# Patient Record
Sex: Female | Born: 1990 | Race: White | Hispanic: No | Marital: Single | Smoking: Former smoker
Health system: Southern US, Community
[De-identification: ages and names within clinical notes are randomized; demographics above are authoritative.]

## PROBLEM LIST (undated history)

## (undated) ENCOUNTER — Inpatient Hospital Stay (HOSPITAL_COMMUNITY): Payer: Self-pay

## (undated) DIAGNOSIS — J45909 Unspecified asthma, uncomplicated: Secondary | ICD-10-CM

---
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
## 2010-02-24 ENCOUNTER — Ambulatory Visit: Payer: Self-pay | Admitting: Family Medicine

## 2010-04-27 ENCOUNTER — Ambulatory Visit: Payer: Self-pay | Admitting: Family Medicine

## 2011-04-26 LAB — URINALYSIS, ROUTINE W REFLEX MICROSCOPIC
Glucose, UA: NEGATIVE
Hgb urine dipstick: NEGATIVE
Ketones, ur: NEGATIVE
Protein, ur: NEGATIVE
pH: 6.5

## 2011-04-26 LAB — COMPREHENSIVE METABOLIC PANEL
ALT: 14
Albumin: 3.5
Alkaline Phosphatase: 59
Calcium: 9.4
Potassium: 3.7
Sodium: 135
Total Protein: 6.7

## 2011-04-26 LAB — CBC
MCHC: 34.9
Platelets: 210
RBC: 4.07
RDW: 13.8

## 2011-05-04 LAB — CBC
HCT: 39.5 % (ref 36.0–49.0)
Hemoglobin: 12.5 g/dL (ref 12.0–16.0)
Hemoglobin: 13.4 g/dL (ref 12.0–16.0)
MCHC: 34 g/dL (ref 31.0–37.0)
MCV: 93.4 fL (ref 78.0–98.0)
MCV: 93.9 fL (ref 78.0–98.0)
Platelets: 208 10*3/uL (ref 150–400)
RBC: 3.93 MIL/uL (ref 3.80–5.70)
RDW: 14 % (ref 11.4–15.5)
WBC: 15 10*3/uL — ABNORMAL HIGH (ref 4.5–13.5)

## 2011-05-14 LAB — RAPID URINE DRUG SCREEN, HOSP PERFORMED
Amphetamines: NOT DETECTED
Barbiturates: NOT DETECTED
Benzodiazepines: NOT DETECTED
Cocaine: NOT DETECTED
Opiates: NOT DETECTED

## 2011-05-14 LAB — CBC
HCT: 37.8
Hemoglobin: 12.9
MCHC: 34.1
MCV: 89.6
Platelets: 212
RBC: 4.23
RDW: 13.2
WBC: 6.6

## 2011-05-14 LAB — CK TOTAL AND CKMB (NOT AT ARMC)
CK, MB: 0.7
Relative Index: INVALID
Total CK: 76

## 2011-05-14 LAB — DIFFERENTIAL
Basophils Absolute: 0
Basophils Relative: 0
Eosinophils Absolute: 0
Eosinophils Relative: 0
Lymphocytes Relative: 24
Lymphs Abs: 1.6
Monocytes Absolute: 0.6
Monocytes Relative: 9
Neutro Abs: 4.4
Neutrophils Relative %: 67

## 2011-05-14 LAB — URINALYSIS, ROUTINE W REFLEX MICROSCOPIC
Bilirubin Urine: NEGATIVE
Glucose, UA: NEGATIVE
Hgb urine dipstick: NEGATIVE
Ketones, ur: 15 — AB
Nitrite: NEGATIVE
Protein, ur: NEGATIVE
Specific Gravity, Urine: 1.016
Urobilinogen, UA: 1
pH: 6

## 2011-05-14 LAB — COMPREHENSIVE METABOLIC PANEL
ALT: 14
AST: 22
Albumin: 4.2
Alkaline Phosphatase: 96
BUN: 8
CO2: 23
Calcium: 9.5
Chloride: 114 — ABNORMAL HIGH
Creatinine, Ser: 0.81
Glucose, Bld: 82
Potassium: 3.6
Sodium: 144
Total Bilirubin: 1.2
Total Protein: 7

## 2011-05-14 LAB — POCT PREGNANCY, URINE: Operator id: 161631

## 2012-05-05 ENCOUNTER — Encounter (HOSPITAL_COMMUNITY): Payer: Self-pay | Admitting: *Deleted

## 2012-05-05 ENCOUNTER — Emergency Department (HOSPITAL_COMMUNITY)
Admission: EM | Admit: 2012-05-05 | Discharge: 2012-05-05 | Disposition: A | Discharge status: Home / Self Care | Payer: Federal, State, Local not specified - PPO | Attending: Emergency Medicine | Admitting: Emergency Medicine

## 2012-05-05 DIAGNOSIS — F17200 Nicotine dependence, unspecified, uncomplicated: Secondary | ICD-10-CM | POA: Insufficient documentation

## 2012-05-05 DIAGNOSIS — J029 Acute pharyngitis, unspecified: Secondary | ICD-10-CM

## 2012-05-05 LAB — RAPID STREP SCREEN (MED CTR MEBANE ONLY): Streptococcus, Group A Screen (Direct): NEGATIVE

## 2012-05-05 MED ORDER — PENICILLIN V POTASSIUM 500 MG PO TABS: 500.0000 mg | ORAL_TABLET | Freq: Three times a day (TID) | ORAL | Status: DC

## 2012-05-05 NOTE — Discharge Instructions (Signed)
 Sore Throat Sore throats may be caused by bacteria and viruses. They may also be caused by:  Smoking.  Pollution.  Allergies. If a sore throat is due to strep infection (a bacterial infection), you may need:  A throat swab.  A culture test to verify the strep infection. You will need one of these:  An antibiotic shot.  Oral medicine for a full 10 days. Strep infection is very contagious. A doctor should check any close contacts who have a sore throat or fever. A sore throat caused by a virus infection will usually last only 3-4 days. Antibiotics will not treat a viral sore throat.  Infectious mononucleosis (a viral disease), however, can cause a sore throat that lasts for up to 3 weeks. Mononucleosis can be diagnosed with blood tests. You must have been sick for at least 1 week in order for the test to give accurate results. HOME CARE INSTRUCTIONS   To treat a sore throat, take mild pain medicine.  Increase your fluids.  Eat a soft diet.  Do not smoke.  Gargling with warm water or salt water (1 tsp. salt in 8 oz. water) can be helpful.  Try throat sprays or lozenges or sucking on hard candy to ease the symptoms. Call your doctor if your sore throat lasts longer than 1 week.  SEEK IMMEDIATE MEDICAL CARE IF:  You have difficulty breathing.  You have increased swelling in the throat.  You have pain so severe that you are unable to swallow fluids or your saliva.  You have a severe headache, a high fever, vomiting, or a red rash. Document Released: 08/23/2004 Document Revised: 10/08/2011 Document Reviewed: 07/03/2007 [facility] Patient Information 2013 [place], [place].    Salt Water Gargle This solution will help make your mouth and throat feel better. HOME CARE INSTRUCTIONS   Mix 1 teaspoon of salt in 8 ounces of warm water.  Gargle with this solution as much or often as you need or as directed. Swish and gargle gently if you have any sores or wounds in your  mouth.  Do not swallow this mixture. Document Released: 04/19/2004 Document Revised: 10/08/2011 Document Reviewed: 09/10/2008 [employer] Patient Information 2013 [employer].     YOUR WILL BE NOTIFIED IF YOUR THROAT CULTURE SHOWS THAT YOU HAVE A STREP INFECTION - IF THIS OCCURS, GO AHEAD AND FILL THE ANTIBIOTIC PRESCRIPTION

## 2012-05-05 NOTE — ED Notes (Signed)
sorethroat since yesterday .Marland Kitchen No temp

## 2012-05-05 NOTE — ED Provider Notes (Signed)
History  This chart was scribed for [name]. Oletta Lamas, MD by Lynelle Smoke. The patient was seen in room TR07C/TR07C. Patient's care was started at 11:15PM.   CSN: [id number]  Arrival date & time 05/05/12  2010   None     Chief Complaint  Patient presents with  . Sore Throat     The history is provided by the patient. No language interpreter was used.   Linda Chandler is a 21 y.o. female who presents to the Emergency Department complaining of gradual onset, gradually worsening, constant, moderate sore throat with associated having a mild subjective fever, chills and a mild cough that started yesterday. She denies taking OTC medications at home to improve symptoms. She reports prior episodes of the same attributed to strep throat. She denies otalgia, nausea, emesis, HA and rash as associated symptoms. She does not have a h/o chronic medical conditions and denies smoking and alcohol use.    History reviewed. No pertinent past medical history.  History reviewed. No pertinent past surgical history.  History reviewed. No pertinent family history.  History  Substance Use Topics  . Smoking status: Current Every Day Smoker  . Smokeless tobacco: Not on file  . Alcohol Use: Yes    No OB history provided.  Review of Systems  Constitutional: Positive for fever and chills.  HENT: Positive for sore throat. Negative for ear pain.   Respiratory: Positive for cough.   Gastrointestinal: Negative for nausea and vomiting.    Allergies  Review of patient's allergies indicates no known allergies.  Home Medications   Current Outpatient Rx  Name Route Sig Dispense Refill  . PENICILLIN V POTASSIUM 500 MG PO TABS Oral Take 1 tablet (500 mg total) by mouth 3 (three) times daily. 30 tablet 0    Triage Vitals: BP 107/62  Pulse 58  Temp 98.2 F (36.8 C) (Oral)  Resp 18  SpO2 98%  Physical Exam  Nursing note and vitals reviewed. Constitutional: She is oriented to person, place, and  time. She appears well-developed and well-nourished. No distress.  HENT:  Head: Normocephalic and atraumatic.       No enlarged tonsils, no tonsillar exudate. Oropharynx is mildly erythematous. TMs normal.  Eyes: EOM are normal.  Neck: Neck supple. No tracheal deviation present.  Cardiovascular: Normal rate.   Pulmonary/Chest: Effort normal. No respiratory distress.  Musculoskeletal: Normal range of motion.  Lymphadenopathy:    She has cervical adenopathy.  Neurological: She is alert and oriented to person, place, and time.  Skin: Skin is warm and dry.  Psychiatric: She has a normal mood and affect. Her behavior is normal.    ED Course  Procedures (including critical care time)  DIAGNOSTIC STUDIES: Oxygen Saturation is 98% on room air, normal by my interpretation.    COORDINATION OF CARE: 11:20PM- Informed pt that her rapid strep test was negative. Patient informed of clinical course, understand medical decision-making process, and agree with plan.     Labs Reviewed  RAPID STREP SCREEN  STREP A DNA PROBE  LAB REPORT - SCANNED   No results found.   1. Pharyngitis       MDM  I personally performed the services described in this documentation, which was scribed in my presence. The recorded information has been reviewed and considered.  Pt with minimal cough, only subj fever, some shoddy lymph nodes, and neg rapid test.  Culture sent adn pt told would be contacted if strep culture was positive.  Pt sent home with  Rx in case culture was indeed positive, but pt understands doesn't have to take if it is neg.    [name]. [name], MD 05/11/12 1710

## 2012-05-06 LAB — STREP A DNA PROBE
Group A Strep Probe: NEGATIVE
Special Requests: NORMAL

## 2015-07-04 ENCOUNTER — Encounter: Payer: Self-pay | Admitting: *Deleted

## 2015-07-04 ENCOUNTER — Emergency Department
Admission: EM | Admit: 2015-07-04 | Discharge: 2015-07-04 | Disposition: A | Discharge status: Home / Self Care | Payer: Federal, State, Local not specified - PPO | Attending: Emergency Medicine | Admitting: Emergency Medicine

## 2015-07-04 DIAGNOSIS — Z792 Long term (current) use of antibiotics: Secondary | ICD-10-CM | POA: Insufficient documentation

## 2015-07-04 DIAGNOSIS — S61211A Laceration without foreign body of left index finger without damage to nail, initial encounter: Secondary | ICD-10-CM | POA: Insufficient documentation

## 2015-07-04 DIAGNOSIS — Y998 Other external cause status: Secondary | ICD-10-CM | POA: Insufficient documentation

## 2015-07-04 DIAGNOSIS — Y9389 Activity, other specified: Secondary | ICD-10-CM | POA: Diagnosis not present

## 2015-07-04 DIAGNOSIS — Y281XXA Contact with knife, undetermined intent, initial encounter: Secondary | ICD-10-CM | POA: Diagnosis not present

## 2015-07-04 DIAGNOSIS — F17210 Nicotine dependence, cigarettes, uncomplicated: Secondary | ICD-10-CM | POA: Insufficient documentation

## 2015-07-04 DIAGNOSIS — Y9289 Other specified places as the place of occurrence of the external cause: Secondary | ICD-10-CM | POA: Diagnosis not present

## 2015-07-04 DIAGNOSIS — S61219A Laceration without foreign body of unspecified finger without damage to nail, initial encounter: Secondary | ICD-10-CM

## 2015-07-04 MED ORDER — LIDOCAINE HCL (PF) 1 % IJ SOLN
INTRAMUSCULAR | Status: AC
  Filled 2015-07-04: qty 5

## 2015-07-04 NOTE — ED Notes (Signed)
Pt cut L 2nd digit w/ knife. Pt has 3-474mm cut to lateral palmar side of L 2nd digit. Bleeding controlled at this time. Clean pressure dressing applied in triage.

## 2015-07-04 NOTE — Discharge Instructions (Signed)
1. Suture removal in 7-10 days. 2. Return to the ER for worsening symptoms, fever, increased redness/swelling, purulent drainage or other concerns.  Laceration Care, Adult A laceration is a cut that goes through all of the layers of the skin and into the tissue that is right under the skin. Some lacerations heal on their own. Others need to be closed with stitches (sutures), staples, skin adhesive strips, or skin glue. Proper laceration care minimizes the risk of infection and helps the laceration to heal better. HOW TO CARE FOR YOUR LACERATION If sutures or staples were used:  Keep the wound clean and dry.  If you were given a bandage (dressing), you should change it at least one time per day or as told by your health care provider. You should also change it if it becomes wet or dirty.  Keep the wound completely dry for the first 24 hours or as told by your health care provider. After that time, you may shower or bathe. However, make sure that the wound is not soaked in water until after the sutures or staples have been removed.  Clean the wound one time each day or as told by your health care provider:  Wash the wound with soap and water.  Rinse the wound with water to remove all soap.  Pat the wound dry with a clean towel. Do not rub the wound.  After cleaning the wound, apply a thin layer of antibiotic ointmentas told by your health care provider. This will help to prevent infection and keep the dressing from sticking to the wound.  Have the sutures or staples removed as told by your health care provider. If skin adhesive strips were used:  Keep the wound clean and dry.  If you were given a bandage (dressing), you should change it at least one time per day or as told by your health care provider. You should also change it if it becomes dirty or wet.  Do not get the skin adhesive strips wet. You may shower or bathe, but be careful to keep the wound dry.  If the wound gets wet,  pat it dry with a clean towel. Do not rub the wound.  Skin adhesive strips fall off on their own. You may trim the strips as the wound heals. Do not remove skin adhesive strips that are still stuck to the wound. They will fall off in time. If skin glue was used:  Try to keep the wound dry, but you may briefly wet it in the shower or bath. Do not soak the wound in water, such as by swimming.  After you have showered or bathed, gently pat the wound dry with a clean towel. Do not rub the wound.  Do not do any activities that will make you sweat heavily until the skin glue has fallen off on its own.  Do not apply liquid, cream, or ointment medicine to the wound while the skin glue is in place. Using those may loosen the film before the wound has healed.  If you were given a bandage (dressing), you should change it at least one time per day or as told by your health care provider. You should also change it if it becomes dirty or wet.  If a dressing is placed over the wound, be careful not to apply tape directly over the skin glue. Doing that may cause the glue to be pulled off before the wound has healed.  Do not pick at the glue. The  skin glue usually remains in place for 5-10 days, then it falls off of the skin. General Instructions  Take over-the-counter and prescription medicines only as told by your health care provider.  If you were prescribed an antibiotic medicine or ointment, take or apply it as told by your doctor. Do not stop using it even if your condition improves.  To help prevent scarring, make sure to cover your wound with sunscreen whenever you are outside after stitches are removed, after adhesive strips are removed, or when glue remains in place and the wound is healed. Make sure to wear a sunscreen of at least 30 SPF.  Do not scratch or pick at the wound.  Keep all follow-up visits as told by your health care provider. This is important.  Check your wound every day for  signs of infection. Watch for:  Redness, swelling, or pain.  Fluid, blood, or pus.  Raise (elevate) the injured area above the level of your heart while you are sitting or lying down, if possible. SEEK MEDICAL CARE IF:  You received a tetanus shot and you have swelling, severe pain, redness, or bleeding at the injection site.  You have a fever.  A wound that was closed breaks open.  You notice a bad smell coming from your wound or your dressing.  You notice something coming out of the wound, such as wood or glass.  Your pain is not controlled with medicine.  You have increased redness, swelling, or pain at the site of your wound.  You have fluid, blood, or pus coming from your wound.  You notice a change in the color of your skin near your wound.  You need to change the dressing frequently due to fluid, blood, or pus draining from the wound.  You develop a new rash.  You develop numbness around the wound. SEEK IMMEDIATE MEDICAL CARE IF:  You develop severe swelling around the wound.  Your pain suddenly increases and is severe.  You develop painful lumps near the wound or on skin that is anywhere on your body.  You have a red streak going away from your wound.  The wound is on your hand or foot and you cannot properly move a finger or toe.  The wound is on your hand or foot and you notice that your fingers or toes look pale or bluish.   This information is not intended to replace advice given to you by your health care provider. Make sure you discuss any questions you have with your health care provider.   Document Released: 07/16/2005 Document Revised: 11/30/2014 Document Reviewed: 07/12/2014 Elsevier Interactive Patient Education [employer]2016 [employer]

## 2015-07-04 NOTE — ED Provider Notes (Signed)
[facility] Emergency Department Provider Note  ____________________________________________  Time seen: Approximately 1:36 AM  I have reviewed the triage vital signs and the nursing notes.   HISTORY  Chief Complaint Extremity Laceration    HPI Linda Chandler is a 24 y.o. female who presents to the ED from home with a chief complaint of finger laceration. Patient cut her left second digit with a clean knife while opening a box. Tetanus shot is up-to-date. Patient is right-hand dominant. Complains of mild pain to incision site. Denies associated symptoms of weakness, numbness/tingling. Nothing makes her symptoms better or worse.   Past medical history None   There are no active problems to display for this patient.   History reviewed. No pertinent past surgical history.  Current Outpatient Rx  Name  Route  Sig  Dispense  Refill  . penicillin v potassium (VEETID) 500 MG tablet   Oral   Take 1 tablet (500 mg total) by mouth 3 (three) times daily.   30 tablet   0     Allergies Review of patient's allergies indicates no known allergies.  History reviewed. No pertinent family history.  Social History Social History  Substance Use Topics  . Smoking status: Current Every Day Smoker    Types: Cigarettes  . Smokeless tobacco: None  . Alcohol Use: Yes     Comment: 07/04/15    Review of Systems Constitutional: No fever/chills Eyes: No visual changes. ENT: No sore throat. Cardiovascular: Denies chest pain. Respiratory: Denies shortness of breath. Gastrointestinal: No abdominal pain.  No nausea, no vomiting.  No diarrhea.  No constipation. Genitourinary: Negative for dysuria. Musculoskeletal: Positive for finger laceration. Negative for back pain. Skin: Negative for rash. Neurological: Negative for headaches, focal weakness or numbness.  10-point ROS otherwise negative.  ____________________________________________   PHYSICAL  EXAM:  VITAL SIGNS: ED Triage Vitals  Enc Vitals Group     BP 07/04/15 0025 131/63 mmHg     Pulse Rate 07/04/15 0025 83     Resp 07/04/15 0025 16     Temp 07/04/15 0025 97.7 F (36.5 C)     Temp Source 07/04/15 0025 Oral     SpO2 07/04/15 0025 99 %     Weight 07/04/15 0025 145 lb (65.772 kg)     Height 07/04/15 0025  (1.727 m)     Head Cir --      Peak Flow --      Pain Score 07/04/15 0026 7     Pain Loc --      Pain Edu? --      Excl. in GC? --     Constitutional: Alert and oriented. Well appearing and in no acute distress. Eyes: Conjunctivae are normal. PERRL. EOMI. Head: Atraumatic. Nose: No congestion/rhinnorhea. Mouth/Throat: Mucous membranes are moist.  Oropharynx non-erythematous. Neck: No stridor.   Cardiovascular: Normal rate, regular rhythm. Grossly normal heart sounds.  Good peripheral circulation. Respiratory: Normal respiratory effort.  No retractions. Lungs CTAB. Gastrointestinal: Soft and nontender. No distention. No abdominal bruits. No CVA tenderness. Musculoskeletal: .75cm horizontal linear laceration to left second digit on the palmar surface at the IP crease. Full range of motion without tendon deficit. Brisk, less than 5 second refill. 2+ radial pulses. Neurologic:  Normal speech and language. No gross focal neurologic deficits are appreciated. No gait instability. Skin:  Skin is warm, dry and intact. No rash noted. Psychiatric: Mood and affect are normal. Speech and behavior are normal.  ____________________________________________   LABS (all  labs ordered are listed, but only abnormal results are displayed)  Labs Reviewed - No data to display ____________________________________________  EKG  None ____________________________________________  RADIOLOGY  None ____________________________________________   PROCEDURES  Procedure(s) performed:   LACERATION REPAIR Performed by: Irean HongSUNG,Diane Hanel J Authorized by: Irean HongSUNG,Jahmeir Geisen J Consent: Verbal  consent obtained. Risks and benefits: risks, benefits and alternatives were discussed Consent given by: patient Patient identity confirmed: provided demographic data Prepped and Draped in normal sterile fashion Wound explored  Laceration Location: Left index finger  Laceration Length: 0.75cm  No Foreign Bodies seen or palpated  Anesthesia: local infiltration  Local anesthetic: lidocaine 1% without epinephrine  Anesthetic total: 6 ml  Irrigation method: syringe Amount of cleaning: standard  Skin closure: 5-0 Nylon  Number of sutures: 3  Technique: Standard sterile  Patient tolerance: Patient tolerated the procedure well with no immediate complications.  Critical Care performed: No  ____________________________________________   INITIAL IMPRESSION / ASSESSMENT AND PLAN / ED COURSE  Pertinent labs & imaging results that were available during my care of the patient were reviewed by me and considered in my medical decision making (see chart for details).  24 year old female who presents with finger laceration. Patient tolerated suture repair well. Strict return precautions given. Patient verbalizes understanding and agrees with plan of care. ____________________________________________   FINAL CLINICAL IMPRESSION(S) / ED DIAGNOSES  Final diagnoses:  Finger laceration, initial encounter      Irean HongJade J Malayshia All, MD 07/04/15 [phone]

## 2015-10-23 ENCOUNTER — Encounter: Payer: Self-pay | Admitting: Emergency Medicine

## 2015-10-23 ENCOUNTER — Observation Stay
Admission: EM | Admit: 2015-10-23 | Discharge: 2015-10-24 | Disposition: A | Discharge status: Home / Self Care | Payer: Federal, State, Local not specified - PPO | Attending: Obstetrics and Gynecology | Admitting: Obstetrics and Gynecology

## 2015-10-23 DIAGNOSIS — Z3A00 Weeks of gestation of pregnancy not specified: Secondary | ICD-10-CM | POA: Diagnosis not present

## 2015-10-23 DIAGNOSIS — O99011 Anemia complicating pregnancy, first trimester: Secondary | ICD-10-CM | POA: Insufficient documentation

## 2015-10-23 DIAGNOSIS — R1030 Lower abdominal pain, unspecified: Secondary | ICD-10-CM | POA: Insufficient documentation

## 2015-10-23 DIAGNOSIS — J45909 Unspecified asthma, uncomplicated: Secondary | ICD-10-CM | POA: Diagnosis not present

## 2015-10-23 DIAGNOSIS — R1031 Right lower quadrant pain: Secondary | ICD-10-CM | POA: Diagnosis not present

## 2015-10-23 DIAGNOSIS — O99331 Smoking (tobacco) complicating pregnancy, first trimester: Secondary | ICD-10-CM | POA: Insufficient documentation

## 2015-10-23 DIAGNOSIS — F17210 Nicotine dependence, cigarettes, uncomplicated: Secondary | ICD-10-CM | POA: Insufficient documentation

## 2015-10-23 DIAGNOSIS — O26891 Other specified pregnancy related conditions, first trimester: Secondary | ICD-10-CM | POA: Insufficient documentation

## 2015-10-23 DIAGNOSIS — O99511 Diseases of the respiratory system complicating pregnancy, first trimester: Secondary | ICD-10-CM | POA: Insufficient documentation

## 2015-10-23 DIAGNOSIS — N939 Abnormal uterine and vaginal bleeding, unspecified: Secondary | ICD-10-CM | POA: Diagnosis not present

## 2015-10-23 DIAGNOSIS — O001 Tubal pregnancy without intrauterine pregnancy: Secondary | ICD-10-CM | POA: Diagnosis not present

## 2015-10-23 DIAGNOSIS — O009 Unspecified ectopic pregnancy without intrauterine pregnancy: Secondary | ICD-10-CM

## 2015-10-23 DIAGNOSIS — E876 Hypokalemia: Secondary | ICD-10-CM | POA: Insufficient documentation

## 2015-10-23 HISTORY — DX: Unspecified asthma, uncomplicated: J45.909

## 2015-10-23 LAB — URINALYSIS COMPLETE WITH MICROSCOPIC (ARMC ONLY)
Bilirubin Urine: NEGATIVE
Glucose, UA: NEGATIVE mg/dL
Ketones, ur: NEGATIVE mg/dL
Leukocytes, UA: NEGATIVE
Nitrite: NEGATIVE
PH: 5 (ref 5.0–8.0)
PROTEIN: 30 mg/dL — AB
Specific Gravity, Urine: 1.023 (ref 1.005–1.030)

## 2015-10-23 LAB — COMPREHENSIVE METABOLIC PANEL
ALBUMIN: 4.1 g/dL (ref 3.5–5.0)
ALK PHOS: 61 U/L (ref 38–126)
ALT: 34 U/L (ref 14–54)
ANION GAP: 8 (ref 5–15)
AST: 36 U/L (ref 15–41)
BUN: 8 mg/dL (ref 6–20)
CALCIUM: 9 mg/dL (ref 8.9–10.3)
CHLORIDE: 108 mmol/L (ref 101–111)
CO2: 21 mmol/L — AB (ref 22–32)
Creatinine, Ser: 0.68 mg/dL (ref 0.44–1.00)
GFR calc Af Amer: >60 mL/min (ref >60)
GFR calc non Af Amer: >60 mL/min (ref >60)
GLUCOSE: 125 mg/dL — AB (ref 65–99)
POTASSIUM: 3.3 mmol/L — AB (ref 3.5–5.1)
SODIUM: 137 mmol/L (ref 135–145)
Total Bilirubin: 0.7 mg/dL (ref 0.3–1.2)
Total Protein: 7.3 g/dL (ref 6.5–8.1)

## 2015-10-23 LAB — CBC
HEMATOCRIT: 36.5 % (ref 35.0–47.0)
HEMOGLOBIN: 12.4 g/dL (ref 12.0–16.0)
MCH: 31.2 pg (ref 26.0–34.0)
MCHC: 34 g/dL (ref 32.0–36.0)
MCV: 91.6 fL (ref 80.0–100.0)
Platelets: 179 10*3/uL (ref 150–440)
RBC: 3.98 MIL/uL (ref 3.80–5.20)
RDW: 12.9 % (ref 11.5–14.5)
WBC: 6.3 10*3/uL (ref 3.6–11.0)

## 2015-10-23 LAB — WET PREP, GENITAL
SPERM: NONE SEEN
Trich, Wet Prep: NONE SEEN
Yeast Wet Prep HPF POC: NONE SEEN

## 2015-10-23 LAB — HCG, QUANTITATIVE, PREGNANCY: hCG, Beta Chain, Quant, S: 547 m[IU]/mL — ABNORMAL HIGH (ref <5)

## 2015-10-23 LAB — POCT PREGNANCY, URINE: PREG TEST UR: POSITIVE — AB

## 2015-10-23 MED ORDER — MORPHINE SULFATE (PF) 4 MG/ML IV SOLN
4.0000 mg | Freq: Once | INTRAVENOUS | Status: AC
  Administered 2015-10-24: 4 mg via INTRAVENOUS
  Filled 2015-10-23: qty 1

## 2015-10-23 MED ORDER — ONDANSETRON HCL 4 MG/2ML IJ SOLN
4.0000 mg | Freq: Once | INTRAMUSCULAR | Status: AC
  Administered 2015-10-24: 4 mg via INTRAVENOUS
  Filled 2015-10-23: qty 2

## 2015-10-23 NOTE — ED Notes (Signed)
MD Webster at bedside 

## 2015-10-23 NOTE — ED Provider Notes (Signed)
[facility] Emergency Department Provider Note  ____________________________________________  Time seen: Approximately 11:04 PM  I have reviewed the triage vital signs and the nursing notes.   HISTORY  Chief Complaint Abdominal Pain and Vaginal Bleeding    HPI Linda Chandler is a 25 y.o. female comes into the hospital today with some severe abdominal pain. She reports that the pain started 20 minutes before she arrived. The patient reports that she tried ibuprofen, a hot shower and a heating pad but the pain did not get any better. The patient reports that she had some flulike symptoms earlier this week with some vomiting with no diarrhea. She reports though that she had been feeling better. But the patient reports that her right lower abdomen is more tender that she's having pain across her lower abdomen. The patient rates her pain a 7 out of 10 in intensity. The patient has had some vaginal bleeding she reports since Monday. It is been like a period. The patient reports that her last nausea. Was December 20 but she did have a miscarriage 3 weeks ago. The patient went to [facility] in [place] and when they didn't ultrasound she reports that they could not find anything. She was told that she would be called if the thing was abnormal but she was never called so she assumed everything was fine. The patient has had one child previously and no other complaints. She's never had pain like this before. The patient denies any further vomiting or diarrhea. She's had no fevers. He is here for evaluation.   Past Medical History  Diagnosis Date  . Asthma     There are no active problems to display for this patient.   History reviewed. No pertinent past surgical history.  Current Outpatient Rx  Name  Route  Sig  Dispense  Refill  . penicillin v potassium (VEETID) 500 MG tablet   Oral   Take 1 tablet (500 mg total) by mouth 3 (three) times daily.   30  tablet   0     Allergies Review of patient's allergies indicates no known allergies.  History reviewed. No pertinent family history.  Social History Social History  Substance Use Topics  . Smoking status: Current Every Day Smoker    Types: Cigarettes  . Smokeless tobacco: Never Used  . Alcohol Use: No     Comment: 07/04/15    Review of Systems Constitutional: fever/chills Eyes: No visual changes. ENT: No sore throat. Cardiovascular: Denies chest pain. Respiratory: Denies shortness of breath. Gastrointestinal: abdominal pain.   nausea,  vomiting.  No diarrhea.  No constipation. Genitourinary: Negative for dysuria. Musculoskeletal: Negative for back pain. Skin: Negative for rash. Neurological: Negative for headaches, focal weakness or numbness.  10-point ROS otherwise negative.  ____________________________________________   PHYSICAL EXAM:  VITAL SIGNS: ED Triage Vitals  Enc Vitals Group     BP 10/23/15 2158 119/72 mmHg     Pulse Rate 10/23/15 2158 82     Resp 10/23/15 2158 20     Temp 10/23/15 2158 98 F (36.7 C)     Temp Source 10/23/15 2158 Oral     SpO2 10/23/15 2158 100 %     Weight 10/23/15 2158 148 lb (67.132 kg)     Height 10/23/15 2158  (1.727 m)     Head Cir --      Peak Flow --      Pain Score 10/23/15 2159 8     Pain Loc --  Pain Edu? --      Excl. in GC? --     Constitutional: Alert and oriented. Well appearing and in moderate distress. Eyes: Conjunctivae are normal. PERRL. EOMI. Head: Atraumatic. Nose: No congestion/rhinnorhea. Mouth/Throat: Mucous membranes are moist.  Oropharynx non-erythematous. Cardiovascular: Normal rate, regular rhythm. Grossly normal heart sounds.  Good peripheral circulation. Respiratory: Normal respiratory effort.  No retractions. Lungs CTAB. Gastrointestinal: Soft with lower abdominal tenderness to palpation. No distention. Positive bowel sounds Genitourinary: Normal external genitalia with mild blood in  the vaginal vault, cervix is closed with some mild cervical motion tenderness to palpation as well as adnexal and uterine tenderness to palpation. Musculoskeletal: No lower extremity tenderness nor edema.   Neurologic:  Normal speech and language.  Skin:  Skin is warm, dry and intact.  Psychiatric: Mood and affect are normal.   ____________________________________________   LABS (all labs ordered are listed, but only abnormal results are displayed)  Labs Reviewed  WET PREP, GENITAL - Abnormal; Notable for the following:    Clue Cells Wet Prep HPF POC PRESENT (*)    WBC, Wet Prep HPF POC MANY (*)    All other components within normal limits  COMPREHENSIVE METABOLIC PANEL - Abnormal; Notable for the following:    Potassium 3.3 (*)    CO2 21 (*)    Glucose, Bld 125 (*)    All other components within normal limits  URINALYSIS COMPLETEWITH MICROSCOPIC ([facility] ONLY) - Abnormal; Notable for the following:    Color, Urine YELLOW (*)    APPearance HAZY (*)    Hgb urine dipstick 3+ (*)    Protein, ur 30 (*)    Bacteria, UA RARE (*)    Squamous Epithelial / LPF 0-5 (*)    All other components within normal limits  HCG, QUANTITATIVE, PREGNANCY - Abnormal; Notable for the following:    hCG, Beta Chain, Quant, S 547 (*)    All other components within normal limits  POCT PREGNANCY, URINE - Abnormal; Notable for the following:    Preg Test, Ur POSITIVE (*)    All other components within normal limits  CHLAMYDIA/NGC RT PCR ([facility] ONLY)  CBC  POC URINE PREG, ED  ABO/RH   ____________________________________________  EKG  None ____________________________________________  RADIOLOGY  US pelvis: No sonographic findings of IUP, large amount of probable hemoperitoneum in the pelvis, left adnexal 4.7 x 3.9 x 4.6 height a vascular mass concerning for ruptured ectopic pregnancy. ____________________________________________   PROCEDURES  Procedure(s) performed: None  Critical  Care performed: No  ____________________________________________   INITIAL IMPRESSION / ASSESSMENT AND PLAN / ED COURSE  Pertinent labs & imaging results that were available during my care of the patient were reviewed by me and considered in my medical decision making (see chart for details).  This is a 25 year old female who comes into the hospital today with some abdominal pain. The patient reports that she did have a miscarriage 3 weeks ago. I will send the patient for an ultrasound as well as give her a dose of morphine and Zofran. I will reassess the patient once I received the results of her ultrasound. I do feel that the patient may have some endometritis causing her symptoms but I will reassess her once I received the ultrasound results.  The patient receive 2 more doses of morphine for her pain. I did contact Dr. Valentino Saxon who will come in to evaluate the patient and take her to surgery. ____________________________________________   FINAL CLINICAL IMPRESSION(S) / ED DIAGNOSES  Final diagnoses:  Ruptured ectopic pregnancy      Rebecka ApleyAllison P Webster, MD 10/24/15 0230

## 2015-10-23 NOTE — ED Notes (Signed)
Pt states onset of right lower quadrant pain that began approx 20 min pta. Pt denies nausea, vomiting, diarrhea, known fever. Pt states had miscarriage 2 weeks pta and has continued vaginal bleeding.

## 2015-10-23 NOTE — ED Notes (Signed)
Pt states "it hurts to stand". Pt also complains of pain radiation into upper abd with movement.

## 2015-10-23 NOTE — ED Notes (Addendum)
PT c/o of N/V, fever/chills beginning Friday 3/24. Pt reports onset of lower right abdominal pain radiating to left side and up abdomen. Pt also reports rash to upper chest. Pt has small raised nodules across upper chest area that appeared Tuesday 3/21. Area is tender to palpation. Pt reports that she believed she had a miscarriage 3 weeks ago. She was between 5-[redacted] weeks pregnant at the time. Pt reports she is bleeding currently. Pt reports bleeding more than is normal for her, a dark red blood. Pt denies large clots in the blood.

## 2015-10-24 ENCOUNTER — Encounter
Admission: EM | Disposition: A | Discharge status: Home / Self Care | Payer: Self-pay | Source: Home / Self Care | Attending: Emergency Medicine

## 2015-10-24 ENCOUNTER — Emergency Department: Payer: Federal, State, Local not specified - PPO | Admitting: Anesthesiology

## 2015-10-24 ENCOUNTER — Emergency Department: Payer: Federal, State, Local not specified - PPO

## 2015-10-24 ENCOUNTER — Encounter: Payer: Self-pay | Admitting: Obstetrics and Gynecology

## 2015-10-24 DIAGNOSIS — O009 Unspecified ectopic pregnancy without intrauterine pregnancy: Secondary | ICD-10-CM | POA: Diagnosis present

## 2015-10-24 HISTORY — PX: DIAGNOSTIC LAPAROSCOPY WITH REMOVAL OF ECTOPIC PREGNANCY: SHX6449

## 2015-10-24 LAB — CBC
HEMATOCRIT: 23.8 % — AB (ref 35.0–47.0)
Hemoglobin: 8 g/dL — ABNORMAL LOW (ref 12.0–16.0)
MCH: 30.8 pg (ref 26.0–34.0)
MCHC: 33.5 g/dL (ref 32.0–36.0)
MCV: 91.8 fL (ref 80.0–100.0)
Platelets: 125 10*3/uL — ABNORMAL LOW (ref 150–440)
RBC: 2.59 MIL/uL — AB (ref 3.80–5.20)
RDW: 12.7 % (ref 11.5–14.5)
WBC: 4.6 10*3/uL (ref 3.6–11.0)

## 2015-10-24 LAB — ABO/RH: ABO/RH(D): O POS

## 2015-10-24 LAB — CHLAMYDIA/NGC RT PCR (ARMC ONLY)
Chlamydia Tr: NOT DETECTED
N GONORRHOEAE: NOT DETECTED

## 2015-10-24 SURGERY — LAPAROSCOPY, WITH ECTOPIC PREGNANCY SURGICAL TREATMENT
Anesthesia: General | Laterality: Left | Wound class: Clean Contaminated

## 2015-10-24 MED ORDER — IBUPROFEN 800 MG PO TABS: 800.0000 mg | ORAL_TABLET | Freq: Three times a day (TID) | ORAL | Status: DC | PRN

## 2015-10-24 MED ORDER — MENTHOL 3 MG MT LOZG: 1.0000 | LOZENGE | OROMUCOSAL | Status: DC | PRN

## 2015-10-24 MED ORDER — BUPIVACAINE HCL 0.5 % IJ SOLN
INTRAMUSCULAR | Status: DC | PRN
  Administered 2015-10-24: 10 mL

## 2015-10-24 MED ORDER — SENNOSIDES-DOCUSATE SODIUM 8.6-50 MG PO TABS: 1.0000 | ORAL_TABLET | Freq: Every evening | ORAL | Status: DC | PRN

## 2015-10-24 MED ORDER — KETOROLAC TROMETHAMINE 30 MG/ML IJ SOLN
30.0000 mg | Freq: Once | INTRAMUSCULAR | Status: AC
  Administered 2015-10-24: 30 mg via INTRAVENOUS

## 2015-10-24 MED ORDER — PANTOPRAZOLE SODIUM 40 MG PO TBEC
40.0000 mg | DELAYED_RELEASE_TABLET | Freq: Every day | ORAL | Status: DC
  Administered 2015-10-24: 40 mg via ORAL
  Filled 2015-10-24: qty 1

## 2015-10-24 MED ORDER — BISACODYL 10 MG RE SUPP: 10.0000 mg | Freq: Every day | RECTAL | Status: DC | PRN

## 2015-10-24 MED ORDER — SIMETHICONE 80 MG PO CHEW: 80.0000 mg | CHEWABLE_TABLET | Freq: Four times a day (QID) | ORAL | Status: DC | PRN

## 2015-10-24 MED ORDER — ONDANSETRON HCL 4 MG PO TABS: 4.0000 mg | ORAL_TABLET | Freq: Four times a day (QID) | ORAL | Status: DC | PRN

## 2015-10-24 MED ORDER — ROCURONIUM BROMIDE 100 MG/10ML IV SOLN
INTRAVENOUS | Status: DC | PRN
  Administered 2015-10-24: 30 mg via INTRAVENOUS

## 2015-10-24 MED ORDER — OXYCODONE-ACETAMINOPHEN 5-325 MG PO TABS
1.0000 | ORAL_TABLET | ORAL | Status: DC | PRN
  Administered 2015-10-24: 2 via ORAL
  Filled 2015-10-24: qty 2

## 2015-10-24 MED ORDER — IBUPROFEN 600 MG PO TABS
600.0000 mg | ORAL_TABLET | Freq: Four times a day (QID) | ORAL | Status: DC | PRN
  Administered 2015-10-24: 600 mg via ORAL
  Filled 2015-10-24: qty 1

## 2015-10-24 MED ORDER — BUPIVACAINE HCL (PF) 0.5 % IJ SOLN
INTRAMUSCULAR | Status: AC
  Filled 2015-10-24: qty 30

## 2015-10-24 MED ORDER — MORPHINE SULFATE (PF) 4 MG/ML IV SOLN
INTRAVENOUS | Status: AC
  Filled 2015-10-24: qty 1

## 2015-10-24 MED ORDER — LACTATED RINGERS IV SOLN
INTRAVENOUS | Status: DC | PRN
  Administered 2015-10-24: 04:00:00 via INTRAVENOUS

## 2015-10-24 MED ORDER — ZOLPIDEM TARTRATE 5 MG PO TABS: 5.0000 mg | ORAL_TABLET | Freq: Every evening | ORAL | Status: DC | PRN

## 2015-10-24 MED ORDER — DOCUSATE SODIUM 100 MG PO CAPS
100.0000 mg | ORAL_CAPSULE | Freq: Two times a day (BID) | ORAL | Status: DC
  Administered 2015-10-24: 100 mg via ORAL
  Filled 2015-10-24: qty 1

## 2015-10-24 MED ORDER — PROPOFOL 10 MG/ML IV BOLUS
INTRAVENOUS | Status: DC | PRN
  Administered 2015-10-24: 120 mg via INTRAVENOUS

## 2015-10-24 MED ORDER — FENTANYL CITRATE (PF) 100 MCG/2ML IJ SOLN
25.0000 ug | INTRAMUSCULAR | Status: DC | PRN
  Administered 2015-10-24 (×5): 25 ug via INTRAVENOUS

## 2015-10-24 MED ORDER — MIDAZOLAM HCL 2 MG/2ML IJ SOLN
INTRAMUSCULAR | Status: DC | PRN
  Administered 2015-10-24: 2 mg via INTRAVENOUS

## 2015-10-24 MED ORDER — HYDROMORPHONE HCL 1 MG/ML IJ SOLN: 0.2000 mg | INTRAMUSCULAR | Status: DC | PRN

## 2015-10-24 MED ORDER — ONDANSETRON HCL 4 MG/2ML IJ SOLN
INTRAMUSCULAR | Status: DC | PRN
  Administered 2015-10-24: 4 mg via INTRAVENOUS

## 2015-10-24 MED ORDER — LACTATED RINGERS IV SOLN
INTRAVENOUS | Status: DC
  Administered 2015-10-24: 11:00:00 via INTRAVENOUS

## 2015-10-24 MED ORDER — LACTATED RINGERS IV BOLUS (SEPSIS)
1000.0000 mL | Freq: Once | INTRAVENOUS | Status: AC
  Administered 2015-10-24: 1000 mL via INTRAVENOUS

## 2015-10-24 MED ORDER — FENTANYL CITRATE (PF) 100 MCG/2ML IJ SOLN
INTRAMUSCULAR | Status: AC
  Administered 2015-10-24: 25 ug via INTRAVENOUS
  Filled 2015-10-24: qty 2

## 2015-10-24 MED ORDER — MORPHINE SULFATE (PF) 4 MG/ML IV SOLN
4.0000 mg | Freq: Once | INTRAVENOUS | Status: AC
  Administered 2015-10-24: 4 mg via INTRAVENOUS

## 2015-10-24 MED ORDER — GLYCOPYRROLATE 0.2 MG/ML IJ SOLN
INTRAMUSCULAR | Status: DC | PRN
  Administered 2015-10-24: 0.6 mg via INTRAVENOUS

## 2015-10-24 MED ORDER — DOCUSATE SODIUM 100 MG PO CAPS: 100.0000 mg | ORAL_CAPSULE | Freq: Two times a day (BID) | ORAL | Status: DC | PRN

## 2015-10-24 MED ORDER — LACTATED RINGERS IR SOLN
Status: DC | PRN
  Administered 2015-10-24: 400 mL

## 2015-10-24 MED ORDER — KETOROLAC TROMETHAMINE 30 MG/ML IJ SOLN
INTRAMUSCULAR | Status: AC
  Filled 2015-10-24: qty 1

## 2015-10-24 MED ORDER — FENTANYL CITRATE (PF) 100 MCG/2ML IJ SOLN
25.0000 ug | INTRAMUSCULAR | Status: DC | PRN
  Administered 2015-10-24: 25 ug via INTRAVENOUS

## 2015-10-24 MED ORDER — ONDANSETRON HCL 4 MG/2ML IJ SOLN: 4.0000 mg | Freq: Four times a day (QID) | INTRAMUSCULAR | Status: DC | PRN

## 2015-10-24 MED ORDER — MAGNESIUM CITRATE PO SOLN: 1.0000 | Freq: Once | ORAL | Status: DC | PRN

## 2015-10-24 MED ORDER — OXYCODONE-ACETAMINOPHEN 5-325 MG PO TABS: 1.0000 | ORAL_TABLET | Freq: Four times a day (QID) | ORAL | Status: DC | PRN

## 2015-10-24 MED ORDER — FERROUS SULFATE 325 (65 FE) MG PO TABS: 325.0000 mg | ORAL_TABLET | Freq: Two times a day (BID) | ORAL | Status: AC

## 2015-10-24 MED ORDER — SUCCINYLCHOLINE CHLORIDE 20 MG/ML IJ SOLN
INTRAMUSCULAR | Status: DC | PRN
  Administered 2015-10-24: 80 mg via INTRAVENOUS

## 2015-10-24 MED ORDER — LIDOCAINE HCL (CARDIAC) 20 MG/ML IV SOLN
INTRAVENOUS | Status: DC | PRN
  Administered 2015-10-24: 60 mg via INTRAVENOUS

## 2015-10-24 MED ORDER — NEOSTIGMINE METHYLSULFATE 10 MG/10ML IV SOLN
INTRAVENOUS | Status: DC | PRN
  Administered 2015-10-24: 3 mg via INTRAVENOUS

## 2015-10-24 MED ORDER — ONDANSETRON HCL 4 MG/2ML IJ SOLN: 4.0000 mg | Freq: Once | INTRAMUSCULAR | Status: DC | PRN

## 2015-10-24 MED ORDER — ALUM & MAG HYDROXIDE-SIMETH 200-200-20 MG/5ML PO SUSP
30.0000 mL | ORAL | Status: DC | PRN
  Filled 2015-10-24: qty 30

## 2015-10-24 MED ORDER — FENTANYL CITRATE (PF) 100 MCG/2ML IJ SOLN
INTRAMUSCULAR | Status: DC | PRN
  Administered 2015-10-24 (×2): 50 ug via INTRAVENOUS

## 2015-10-24 SURGICAL SUPPLY — 38 items
APPLICATOR COTTON TIP 6IN STRL (MISCELLANEOUS) ×6 IMPLANT
BLADE SURG SZ11 CARB STEEL (BLADE) ×3 IMPLANT
CANISTER SUCT 1200ML W/VALVE (MISCELLANEOUS) ×3 IMPLANT
CATH ROBINSON RED A/P 16FR (CATHETERS) ×3 IMPLANT
CHLORAPREP W/TINT 26ML (MISCELLANEOUS) ×3 IMPLANT
CORD MONOPOLAR M/FML 12FT (MISCELLANEOUS) IMPLANT
GLOVE BIO SURGEON STRL SZ 6 (GLOVE) ×3 IMPLANT
GLOVE BIO SURGEON STRL SZ8 (GLOVE) IMPLANT
GLOVE BIOGEL PI IND STRL 6.5 (GLOVE) ×1 IMPLANT
GLOVE BIOGEL PI INDICATOR 6.5 (GLOVE) ×2
GLOVE INDICATOR 8.0 STRL GRN (GLOVE) IMPLANT
GOWN STRL REUS W/ TWL LRG LVL3 (GOWN DISPOSABLE) ×2 IMPLANT
GOWN STRL REUS W/TWL LRG LVL3 (GOWN DISPOSABLE) ×4
GOWN STRL REUS W/TWL XL LVL3 (GOWN DISPOSABLE) IMPLANT
IRRIGATION STRYKERFLOW (MISCELLANEOUS) ×1 IMPLANT
IRRIGATOR STRYKERFLOW (MISCELLANEOUS) ×3
IV LACTATED RINGERS 1000ML (IV SOLUTION) ×3 IMPLANT
KIT PINK PAD W/HEAD ARE REST (MISCELLANEOUS) ×3
KIT PINK PAD W/HEAD ARM REST (MISCELLANEOUS) ×1 IMPLANT
KIT RM TURNOVER CYSTO AR (KITS) ×3 IMPLANT
LABEL OR SOLS (LABEL) IMPLANT
LIQUID BAND (GAUZE/BANDAGES/DRESSINGS) ×3 IMPLANT
NS IRRIG 1000ML POUR BTL (IV SOLUTION) IMPLANT
NS IRRIG 500ML POUR BTL (IV SOLUTION) ×3 IMPLANT
PACK GYN LAPAROSCOPIC (MISCELLANEOUS) ×3 IMPLANT
PAD OB MATERNITY 4.3X12.25 (PERSONAL CARE ITEMS) ×3 IMPLANT
PAD PREP 24X41 OB/GYN DISP (PERSONAL CARE ITEMS) ×3 IMPLANT
POUCH ENDO CATCH 10MM SPEC (MISCELLANEOUS) ×3 IMPLANT
SCISSORS METZENBAUM CVD 33 (INSTRUMENTS) IMPLANT
SHEARS HARMONIC ACE PLUS 36CM (ENDOMECHANICALS) ×3 IMPLANT
SLEEVE ENDOPATH XCEL 5M (ENDOMECHANICALS) ×3 IMPLANT
SUT VIC AB 3-0 SH 27 (SUTURE) ×2
SUT VIC AB 3-0 SH 27X BRD (SUTURE) ×1 IMPLANT
SUT VICRYL 0 AB UR-6 (SUTURE) ×3 IMPLANT
TROCAR ENDO BLADELESS 11MM (ENDOMECHANICALS) ×3 IMPLANT
TROCAR XCEL NON-BLD 5MMX100MML (ENDOMECHANICALS) ×3 IMPLANT
TROCAR XCEL UNIV SLVE 11M 100M (ENDOMECHANICALS) IMPLANT
TUBING INSUFFLATOR HI FLOW (MISCELLANEOUS) ×3 IMPLANT

## 2015-10-24 NOTE — Op Note (Signed)
Procedure(s): OPERATIVE LAPAROSCOPY WITH REMOVAL OF LEFT ECTOPIC PREGNANCY Procedure Note  Linda CoilChristina N Hogenson female 25 y.o. 10/24/2015  Indications: The patient is a 25 y.o. 422P1011 female with lower abdominal pain, vaginal bleeding, positive pregnancy test, and suspected ruptured ectopic pregnancy on ultrasound  Pre-operative Diagnosis: Ruptured left fallopian tube ectopic pregnancy  Post-operative Diagnosis: Same  Surgeon: Hildred LaserAnika Odai Wimmer, MD  Assistants: None  Anesthesia: General endotracheal anesthesia  ASA Class: II  Procedure Details: The patient was seen in the Holding Room. The risks, benefits, complications, treatment options, and expected outcomes were discussed with the patient.  The patient concurred with the proposed plan, giving informed consent.  The site of surgery properly noted/marked. The patient was taken to the Operating Room, identified as Linda Chandler and the procedure verified as Procedure(s) (LRB): DIAGNOSTIC LAPAROSCOPY WITH REMOVAL OF LEFT ECTOPIC PREGNANCY (Left). A Time Out was held and the above information confirmed.  She was then placed under general anesthesia without difficulty. She was placed in the dorsal lithotomy position, and was prepped and draped in a sterile manner.  A straight catheterization was performed. A sterile speculum was inserted into the vagina and the cervix was grasped at the anterior lip using a single-toothed tenaculum.  The uterus was sounded to 8 cm, and a Hulka clamp was placed for uterine manipulation.  The speculum and tenaculum were then removed. After an adequate timeout was performed, attention was turned to the abdomen where an umbilical incision was made with the scalpel.  The Optiview 5-mm trocar and sleeve were then advanced without difficulty with the laparoscope under direct visualization into the abdomen.  The abdomen was then insufflated with carbon dioxide gas and adequate pneumoperitoneum was obtained. A 5-mm  left lower quadrant port and an 11-mm right lower quadrant port were then placed under direct visualization.  All incision sites were injected with 1% Sensorcaine (for a total of 10 ml).  A survey of the patient's pelvis and abdomen revealed the findings as above. The hemoperitoneum was evacuated using a suction irigator. The left fallopian tube including the ectopic pregnancy  was grasped and ligated from the underlying mesosalpinx and uterine attachment using the Harmonic instrument.  Good hemostasis was noted.  The specimen was placed in an EndoCatch bag and removed from the abdomen intact. The pelvis was surveyed again, and then irrigated.  The 11 mm trochar was then removed.  The skin and fascial incision were extended to be able to remove the specimen. The fascia of the incision was then grasped with Kocher clamps and approximated using 0-Vicryl in a running fashion.  The subcutaneous fat layer was approximated using 3-0 Vicryl in a running fashion.   The pneumoperitoneum was then re-established, and the laparoscope was then introduced once again into the abdominal cavity.  A final survey was performed, where good hemostasis was noted on the right side.  There was slight oozing noted from the left surgical site, which was treated using the Kleppingers.  Good hemostasis was then achieved.  All trocars were removed under direct visualization, and the abdomen which was desufflated.    All skin incisions were closed with 4-0 Monocryl subcuticular stitches. The patient tolerated the procedures well.  All instruments, needles, and sponge counts were correct x 2. The patient was taken to the recovery room awake, extubated and in stable condition.   Findings: The uterus was sounded to 8 cm.  On laparoscopy, there was approximately 300 ml of hmoperitoneum (including of blood and clots).  Dilated  left fallopian tube near fimbriated end containing ectopic gestation. Small normal appearing uterus, normal right  fallopian tube, right ovary and left ovary.  Estimated Blood Loss:  300 ml (300 in hemoperitoneum, minimal surgical loss)      Drains: straight catheterization prior to procedure with  50 ml of clear urine         Total IV Fluids:   1400 ml  Specimens:  Left fallopian tube with ectopic.          Implants: None         Complications:  None; patient tolerated the procedure well.         Disposition: PACU - hemodynamically stable.         Condition: stable   Hildred Laser, MD [facility]    Hildred Laser, MD [facility]

## 2015-10-24 NOTE — Progress Notes (Signed)
Day of Surgery #0  Procedure(s) (LRB): DIAGNOSTIC LAPAROSCOPY WITH REMOVAL OF LEFT ECTOPIC PREGNANCY (Left)  Subjective: Patient reports incisional pain (mild), controlled with medications.  Is tolerating PO, ambulating without difficulty, and voiding.  Desires to go home if possible.   Objective: I have reviewed patient's vital signs, intake and output, medications and labs.  Temp:  [97.3 F (36.3 C)-98.3 F (36.8 C)] 98.3 F (36.8 C) (03/27 1610) Pulse Rate:  [49-86] 51 (03/27 1638) Resp:  [9-24] 19 (03/27 1610) BP: (87-136)/(16-100) 87/43 mmHg (03/27 1638) SpO2:  [94 %-100 %] 98 % (03/27 1610) Weight:  [148 lb (67.132 kg)] 148 lb (67.132 kg) (03/26 2158)  General: alert and no distress Resp: clear to auscultation bilaterally Cardio: S1, S2 normal and regular rhythm. Bradycardia noted.  GI: soft, non-tender; bowel sounds normal; no masses,  no organomegaly.  Incision sites c/d/i with Liquiband. Extremities: extremities normal, atraumatic, no cyanosis or edema Vaginal Bleeding: minimal  Assessment: s/p Procedure(s): DIAGNOSTIC LAPAROSCOPY WITH REMOVAL OF LEFT ECTOPIC PREGNANCY (Left): stable, progressing well, tolerating diet and anemia  Plan: Continue regular diet Continue to encourage ambulation Continue PO medication Patient with intermittent episodes of hypotension.  Asymptomatic.  Will administer 1L bolus of IVF.  Anemia - asymptomatic. Will treat with PO iron supplementation outpatient.  Discharge home later this evening if VSS.     Linda Chandler 10/24/2015, 5:17 PM

## 2015-10-24 NOTE — Discharge Instructions (Signed)
General Gynecological Post-Operative Instructions °You may expect to feel dizzy, weak, and drowsy for as long as 24 hours after receiving the medicine that made you sleep (anesthetic).  °Do not drive a car, ride a bicycle, participate in physical activities, or take public transportation until you are done taking narcotic pain medicines or as directed by your doctor.  °Do not drink alcohol or take tranquilizers.  °Do not take medicine that has not been prescribed by your doctor.  °Do not sign important papers or make important decisions while on narcotic pain medicines.  °Have a responsible person with you.  °CARE OF INCISION  °Keep incision clean and dry. °Take showers instead of baths until your doctor gives you permission to take baths.  °Avoid heavy lifting (more than 10 pounds/4.5 kilograms), pushing, or pulling.  °Avoid activities that may risk injury to your surgical site.  °No sexual intercourse or placement of anything in the vagina for 2 weeks or as instructed by your doctor. °If you have tubes coming from the wound site, check with your doctor regarding appropriate care of the tubes. °Only take prescription or over-the-counter medicines  for pain, discomfort, or fever as directed by your doctor. Do not take aspirin. It can make you bleed. Take medicines (antibiotics) that kill germs if they are prescribed for you.  °Call the office or go to the MAU if:  °You feel sick to your stomach (nauseous).  °You start to throw up (vomit).  °You have trouble eating or drinking.  °You have an oral temperature above 101.  °You have constipation that is not helped by adjusting diet or increasing fluid intake. Pain medicines are a common cause of constipation.  °You have any other concerns. °SEEK IMMEDIATE MEDICAL CARE IF:  °You have persistent dizziness.  °You have difficulty breathing or a congested sounding (croupy) cough.  °You have an oral temperature above 102.5, not controlled by medicine.  °There is increasing  pain or tenderness near or in the surgical site.  ° ° °

## 2015-10-24 NOTE — Discharge Summary (Signed)
  Gynecology Physician Postoperative Discharge Summary  Patient ID: Linda Chandler Staff MRN: [id number] DOB/AGE: 25/07/1990 24 y.o.  Admit Date: 10/23/2015 Discharge Date: 10/24/2015  Preoperative Diagnoses: Ruptured left ectopic pregnancy with hemoperitoneum.  Procedures: Procedure(s) (LRB): DIAGNOSTIC LAPAROSCOPY WITH REMOVAL OF LEFT ECTOPIC PREGNANCY (Left)  Hospital Course:  Linda Chandler Pollan is a 25 y.o. G2P1011who was seen in the Emergency Room and admitted for surgery noted above.  She underwent the procedures as mentioned above, her operation was uncomplicated. For further details about surgery, please refer to the operative report. Patient had an uncomplicated postoperative course. By time of discharge on POD#0, her pain was controlled on oral pain medications; she was ambulating, voiding without difficulty, tolerating regular diet and passing flatus. She was deemed stable for discharge to home.   Significant Labs: CBC Latest Ref Rng 10/24/2015 10/23/2015 07/24/2008  WBC 3.6 - 11.0 K/uL 4.6 6.3 13.8(H)  Hemoglobin 12.0 - 16.0 g/dL 8.0(L) 12.4 12.5  Hematocrit 35.0 - 47.0 % 23.8(L) 36.5 36.7  Platelets 150 - 440 K/uL 125(L) 179 172    Discharge Exam: Blood pressure 95/37, pulse 55, temperature 98.3 F (36.8 C), temperature source Oral, resp. rate 19, height 5\' 8"  (1.727 m), weight 148 lb (67.132 kg), last menstrual period 07/19/2015, SpO2 98 %. General appearance: alert and no distress  Resp: clear to auscultation bilaterally  Cardio: regular rate and rhythm  GI: soft, non-tender; bowel sounds normal; no masses, no organomegaly.  Incision: C/D/I, no erythema, no drainage noted Pelvic: scant blood on pad  Extremities: extremities normal, atraumatic, no cyanosis or edema and Homans sign is negative, no sign of DVT  Discharged Condition: Stable  Disposition: 01-Home or Self Care     Medication List    TAKE these medications        docusate sodium 100 MG capsule   Commonly known as:  COLACE  Take 1 capsule (100 mg total) by mouth 2 (two) times daily as needed for mild constipation.     ferrous sulfate 325 (65 FE) MG tablet  Commonly known as:  FERROUSUL  Take 1 tablet (325 mg total) by mouth 2 (two) times daily.     ibuprofen 800 MG tablet  Commonly known as:  ADVIL,MOTRIN  Take 1 tablet (800 mg total) by mouth every 8 (eight) hours as needed.     oxyCODONE-acetaminophen 5-325 MG tablet  Commonly known as:  PERCOCET/ROXICET  Take 1-2 tablets by mouth every 6 (six) hours as needed for severe pain (moderate to severe pain (when tolerating fluids)).     simethicone 80 MG chewable tablet  Commonly known as:  GAS-X  Chew 1 tablet (80 mg total) by mouth every 6 (six) hours as needed for flatulence (bloating).           Follow-up Information    Follow up with Hildred LaserAnika Blane Worthington, MD In 10 days.   Specialties:  Obstetrics and Gynecology, Radiology   Why:  For wound re-check   Contact information:   [address] [place] [address] [phone]       Signed:  Hildred LaserAnika Chrisy Hillebrand, MD [facility]

## 2015-10-24 NOTE — Progress Notes (Signed)
Discharge instructions complete and prescriptions given. Patient verbalizes understanding of teaching. Patient discharged home at 1830. 

## 2015-10-24 NOTE — ED Notes (Signed)
Pt's mother, Rosey Batheresa would like to be notified for any changes in pt status. [phone]

## 2015-10-24 NOTE — Anesthesia Procedure Notes (Signed)
Procedure Name: Intubation Date/Time: 10/24/2015 4:00 AM Performed by: [id number], Marshun Duva Pre-anesthesia Checklist: Patient being monitored, Suction available, Emergency Drugs available, Patient identified and Timeout performed Patient Re-evaluated:Patient Re-evaluated prior to inductionOxygen Delivery Method: Circle system utilized Preoxygenation: Pre-oxygenation with 100% oxygen Intubation Type: Rapid sequence and IV induction Laryngoscope Size: Mac and 3 Grade View: Grade I Tube type: Oral Tube size: 7.0 mm Number of attempts: 1 Placement Confirmation: breath sounds checked- equal and bilateral,  CO2 detector,  positive ETCO2 and ETT inserted through vocal cords under direct vision Secured at: 21 cm Tube secured with: Tape

## 2015-10-24 NOTE — Transfer of Care (Signed)
Immediate Anesthesia Transfer of Care Note  Patient: Linda Chandler  Procedure(s) Performed: Procedure(s): DIAGNOSTIC LAPAROSCOPY WITH REMOVAL OF LEFT ECTOPIC PREGNANCY (Left)  Patient Location: PACU  Anesthesia Type:General  Level of Consciousness: awake and alert   Airway & Oxygen Therapy: Patient Spontanous Breathing and Patient connected to face mask oxygen  Post-op Assessment: Report given to RN  Post vital signs: Reviewed and stable  Last Vitals:  Filed Vitals:   10/24/15 0330 10/24/15 0502  BP: 117/66 136/100  Pulse: 55 86  Temp:  36.3 C  Resp: 16 12    Complications: No apparent anesthesia complications

## 2015-10-24 NOTE — H&P (Addendum)
GYNECOLOGY HISTORY AND PHYSICAL   Reason for Consult: suspected ruptured ectopic.  Referring Physician: Charlesetta Ivory, MD (ER Physician)  Linda Chandler is an 25 y.o.  G47P1011 female who presented to the Emergency Room with complaints of severe lower abdominal pain, with onset at approximately 9:30 p.m.  Pain is more right sided, rated approximately 7 out of 10 pain. Tried Ibuprofen and heating pads without relief. Patient notes that she was seen at [facility] in [place] approximately 2-3 weeks ago with complaints of vaginal bleeding.  Notes that an ultrasound was performed however they could not see anything (was told that she had a very early pregnancy).  Told her that she might be having a miscarriage. States that she was told to f/u in 2 weeks, however has not yet had an appointment. Notes that she has been bleeding since that time, with resolution ~ 3 days ago, however bleeding resumed today.  Denies passage of large clots or tissue products.  Currently denies any vomiting , but did note nausea.  Last ate at 12:00 pm yesterday afternoon.  Has received IV morphine for pain.   Pertinent Gynecological History: Contraception: none  Blood transfusions: none Sexually transmitted diseases: no past history Previous GYN Procedures: None  Last pap: normal Date: 2014   Menstrual History: Menarche age:  Patient's last menstrual period was 07/19/2015. Menses: flow is moderate and regular every 30 days without intermenstrual spotting   OB History  Gravida Para Term Preterm AB SAB TAB Ectopic Multiple Living  2 1 1  1 1    1     # Outcome Date GA Lbr Len/2nd Weight Sex Delivery Anes PTL Lv  2 SAB 2015 [redacted]w[redacted]d        1 Term 2013 [id number]     Y      Past Medical History  Diagnosis Date  . Asthma     History reviewed. No pertinent past surgical history.  History reviewed. No pertinent family history.  Social History:  reports that she has been smoking Cigarettes.  She has never  used smokeless tobacco. She reports that she does not drink alcohol or use illicit drugs.  Allergies: No Known Allergies  Medications: Prior to Admission:  No current facility-administered medications on file prior to encounter.   No current outpatient prescriptions on file prior to encounter.     Review of Systems  Constitutional: Positive for malaise/fatigue. Negative for fever and chills.  HENT: Positive for congestion. Negative for sore throat.   Eyes: Negative.   Respiratory: Positive for cough. Negative for sputum production, shortness of breath and wheezing.        Reports mild flu-like symptoms last week  Cardiovascular: Negative.   Gastrointestinal: Positive for nausea, vomiting and abdominal pain. Negative for diarrhea and constipation.       Had vomiting episode last week, no further occurrences.   Genitourinary: Negative for dysuria, urgency, frequency, hematuria and flank pain.  Musculoskeletal: Negative.   Skin: Negative.   Neurological: Negative for weakness and headaches.  Endo/Heme/Allergies: Negative.   Psychiatric/Behavioral: Negative.     Blood pressure 110/54, pulse 49, temperature 98 F (36.7 C), temperature source Oral, resp. rate 14, height 5' 8"  (1.727 m), weight 148 lb (67.132 kg), last menstrual period 07/19/2015, SpO2 94 %. Physical Exam  Constitutional: She is oriented to person, place, and time. She appears well-developed and well-nourished. No distress.  HENT:  Head: Normocephalic and atraumatic.  Eyes: Conjunctivae and EOM are normal. Pupils are equal,  round, and reactive to light.  Neck: Normal range of motion. Neck supple. No tracheal deviation present. No thyromegaly present.  Cardiovascular: Normal rate, regular rhythm and normal heart sounds.  Exam reveals no gallop and no friction rub.   No murmur heard. Respiratory: Effort normal and breath sounds normal. No respiratory distress.  GI: Soft. Bowel sounds are normal. She exhibits no  distension and no mass. There is tenderness. There is no rebound and no guarding.  Right>left  Genitourinary: Vagina normal and uterus normal. No vaginal discharge found.  Vagina with small amount of dark red blood in vaginal vault.  Cervix closed.  Adnexal tenderness bilaterally.   Musculoskeletal: Normal range of motion. She exhibits no edema.  Neurological: She is alert and oriented to person, place, and time.  Skin: Skin is warm and dry. No rash noted. No erythema.  Psychiatric: She has a normal mood and affect. Her behavior is normal.    Results for orders placed or performed during the hospital encounter of 10/23/15 (from the past 48 hour(s))  Comprehensive metabolic panel     Status: Abnormal   Collection Time: 10/23/15 10:03 PM  Result Value Ref Range   Sodium 137 135 - 145 mmol/L   Potassium 3.3 (L) 3.5 - 5.1 mmol/L   Chloride 108 101 - 111 mmol/L   CO2 21 (L) 22 - 32 mmol/L   Glucose, Bld 125 (H) 65 - 99 mg/dL   BUN 8 6 - 20 mg/dL   Creatinine, Ser 0.68 0.44 - 1.00 mg/dL   Calcium 9.0 8.9 - 10.3 mg/dL   Total Protein 7.3 6.5 - 8.1 g/dL   Albumin 4.1 3.5 - 5.0 g/dL   AST 36 15 - 41 U/L   ALT 34 14 - 54 U/L   Alkaline Phosphatase 61 38 - 126 U/L   Total Bilirubin 0.7 0.3 - 1.2 mg/dL   GFR calc non Af Amer >60 >60 mL/min   GFR calc Af Amer >60 >60 mL/min    Comment: (NOTE) The eGFR has been calculated using the CKD EPI equation. This calculation has not been validated in all clinical situations. eGFR's persistently <60 mL/min signify possible Chronic Kidney Disease.    Anion gap 8 5 - 15  CBC     Status: None   Collection Time: 10/23/15 10:03 PM  Result Value Ref Range   WBC 6.3 3.6 - 11.0 K/uL   RBC 3.98 3.80 - 5.20 MIL/uL   Hemoglobin 12.4 12.0 - 16.0 g/dL   HCT 36.5 35.0 - 47.0 %   MCV 91.6 80.0 - 100.0 fL   MCH 31.2 26.0 - 34.0 pg   MCHC 34.0 32.0 - 36.0 g/dL   RDW 12.9 11.5 - 14.5 %   Platelets 179 150 - 440 K/uL  Urinalysis complete, with microscopic  ([facility] only)     Status: Abnormal   Collection Time: 10/23/15 10:03 PM  Result Value Ref Range   Color, Urine YELLOW (A) YELLOW   APPearance HAZY (A) CLEAR   Glucose, UA NEGATIVE NEGATIVE mg/dL   Bilirubin Urine NEGATIVE NEGATIVE   Ketones, ur NEGATIVE NEGATIVE mg/dL   Specific Gravity, Urine 1.023 1.005 - 1.030   Hgb urine dipstick 3+ (A) NEGATIVE   pH 5.0 5.0 - 8.0   Protein, ur 30 (A) NEGATIVE mg/dL   Nitrite NEGATIVE NEGATIVE   Leukocytes, UA NEGATIVE NEGATIVE   RBC / HPF TOO NUMEROUS TO COUNT 0 - 5 RBC/hpf   WBC, UA 0-5 0 - 5 WBC/hpf  Bacteria, UA RARE (A) NONE SEEN   Squamous Epithelial / LPF 0-5 (A) NONE SEEN   Mucous PRESENT   ABO/Rh     Status: None   Collection Time: 10/23/15 10:03 PM  Result Value Ref Range   ABO/RH(D) O POS   Pregnancy, urine POC     Status: Abnormal   Collection Time: 10/23/15 10:08 PM  Result Value Ref Range   Preg Test, Ur POSITIVE (A) NEGATIVE    Comment:        THE SENSITIVITY OF THIS METHODOLOGY IS >24 mIU/mL   hCG, quantitative, pregnancy     Status: Abnormal   Collection Time: 10/23/15 10:33 PM  Result Value Ref Range   hCG, Beta Chain, Quant, S 547 (H) <5 mIU/mL    Comment:          GEST. AGE      CONC.  (mIU/mL)   <=1 WEEK        5 - 50     2 WEEKS       50 - 500     3 WEEKS       100 - 10,000     4 WEEKS     1,000 - 30,000     5 WEEKS     3,500 - 115,000   6-8 WEEKS     12,000 - 270,000    12 WEEKS     15,000 - 220,000        FEMALE AND NON-PREGNANT FEMALE:     LESS THAN 5 mIU/mL   Wet prep, genital     Status: Abnormal   Collection Time: 10/23/15 11:33 PM  Result Value Ref Range   Yeast Wet Prep HPF POC NONE SEEN NONE SEEN   Trich, Wet Prep NONE SEEN NONE SEEN   Clue Cells Wet Prep HPF POC PRESENT (A) NONE SEEN   WBC, Wet Prep HPF POC MANY (A) NONE SEEN   Sperm NONE SEEN   Chlamydia/NGC rt PCR ([facility] only)     Status: None   Collection Time: 10/23/15 11:33 PM  Result Value Ref Range   Specimen source GC/Chlam  ENDOCERVICAL    Chlamydia Tr NOT DETECTED NOT DETECTED   N gonorrhoeae NOT DETECTED NOT DETECTED    Comment: (NOTE) 100  This methodology has not been evaluated in pregnant women or in 200  patients with a history of hysterectomy. 300 400  This methodology will not be performed on patients less than 32  years of age.     US Ob Comp Less 14 Wks  10/24/2015  CLINICAL DATA:  Miscarriage 3 weeks ago with persistent vaginal bleeding. RIGHT lower quadrant pain for 3 hours. Beta HCG 547 EXAM: OBSTETRIC <14 WK ULTRASOUND TECHNIQUE: Transabdominal ultrasound was performed for evaluation of the gestation as well as the maternal uterus and adnexal regions. COMPARISON:  None. FINDINGS: Intrauterine gestational sac: Not present. Yolk sac:  Not present. Embryo:  Not present. Cardiac Activity: Not present. Maternal uterus/adnexae: Large amount of echogenic free fluid in the pelvis. RIGHT ovary is 3 x 2.3 x 2.6 cm, normal in appearance. LEFT para ovarian 4.7 x 3.9 x 4.6 cm echogenic hypervascular mass with central anechoic component which is nonspecific though could represent empty gestational sac. 8 mm homogeneous avascular endometrium. Normal appearance of the uterus. IMPRESSION: No sonographic findings of IUP. Large amount of probable hemo peritoneum in the pelvis. LEFT adnexal 4.7 x 3.9 x 4.6 cm hypervascular mass concerning for ruptured ectopic pregnancy. Acute findings discussed  with and reconfirmed by Mont Dutton on 10/24/2015 at 1:52 am. Electronically Signed   By: Elon Alas M.D.   On: 10/24/2015 01:52   US Ob Transvaginal  10/24/2015  CLINICAL DATA:  Miscarriage 3 weeks ago with persistent vaginal bleeding. RIGHT lower quadrant pain for 3 hours. Beta HCG 547 EXAM: OBSTETRIC <14 WK ULTRASOUND TECHNIQUE: Transabdominal ultrasound was performed for evaluation of the gestation as well as the maternal uterus and adnexal regions. COMPARISON:  None. FINDINGS: Intrauterine gestational sac: Not  present. Yolk sac:  Not present. Embryo:  Not present. Cardiac Activity: Not present. Maternal uterus/adnexae: Large amount of echogenic free fluid in the pelvis. RIGHT ovary is 3 x 2.3 x 2.6 cm, normal in appearance. LEFT para ovarian 4.7 x 3.9 x 4.6 cm echogenic hypervascular mass with central anechoic component which is nonspecific though could represent empty gestational sac. 8 mm homogeneous avascular endometrium. Normal appearance of the uterus. IMPRESSION: No sonographic findings of IUP. Large amount of probable hemo peritoneum in the pelvis. LEFT adnexal 4.7 x 3.9 x 4.6 cm hypervascular mass concerning for ruptured ectopic pregnancy. Acute findings discussed with and reconfirmed by Dr.ALLISON WEBSTER on 10/24/2015 at 1:52 am. Electronically Signed   By: Elon Alas M.D.   On: 10/24/2015 01:52    Assessment/Plan: 1. Suspected ruptured left ectopic pregnancy - Left adnexal mass with hemoperitoneum suspicious for ruptured ectopic.  However patient's pain noted more on right.  I have had a discussion with the patient regar ding test and imaging results.  Recommend operative laparoscopy with evacuation of hemoperitoneum and likely unilateral salpingectomy. The risks of surgery were discussed in detail with the patient including but not limited to: bleeding which may require transfusion or reoperation; infection which may require prolonged hospitalization or re-hospitalization and antibiotic therapy; injury to bowel, bladder, ureters and major vessels or other surrounding organs; need for additional procedures including laparotomy; thromboembolic phenomenon, incisional problems and other postoperative or anesthesia complications.  Patient was told that the likelihood that her condition and symptoms will be treated effectively with this surgical management was very high; the postoperative expectations were also discussed in detail. All questions were answered.  Consents signed. Patient currently  hemodynamically stable. Can proceed to OR when ready.  2. Hypokalemia (mild) - Can increase PO intake of potassium rich foods  Rubie Maid 10/24/2015

## 2015-10-24 NOTE — Anesthesia Preprocedure Evaluation (Signed)
Anesthesia Evaluation  Patient identified by MRN, date of birth, ID band Patient awake    Reviewed: NPO status , Patient's Chart, lab work & pertinent test results  Airway Mallampati: II       Dental  (+) Teeth Intact   Pulmonary asthma , Current Smoker,    breath sounds clear to auscultation       Cardiovascular negative cardio ROS   Rhythm:Regular     Neuro/Psych    GI/Hepatic negative GI ROS, Neg liver ROS,   Endo/Other  negative endocrine ROS  Renal/GU negative Renal ROS  negative genitourinary   Musculoskeletal negative musculoskeletal ROS (+)   Abdominal   Peds negative pediatric ROS (+)  Hematology negative hematology ROS (+)   Anesthesia Other Findings   Reproductive/Obstetrics                             Anesthesia Physical Anesthesia Plan  ASA: II and emergent  Anesthesia Plan: General   Post-op Pain Management:    Induction: Intravenous  Airway Management Planned: Oral ETT  Additional Equipment:   Intra-op Plan:   Post-operative Plan: Extubation in OR  Informed Consent: I have reviewed the patients History and Physical, chart, labs and discussed the procedure including the risks, benefits and alternatives for the proposed anesthesia with the patient or authorized representative who has indicated his/her understanding and acceptance.     Plan Discussed with: CRNA  Anesthesia Plan Comments:         Anesthesia Quick Evaluation

## 2015-10-24 NOTE — ED Notes (Signed)
Patient transported to Ultrasound 

## 2015-10-25 ENCOUNTER — Telehealth: Payer: Self-pay | Admitting: Obstetrics and Gynecology

## 2015-10-25 LAB — SURGICAL PATHOLOGY

## 2015-10-25 NOTE — Anesthesia Postprocedure Evaluation (Signed)
Anesthesia Post Note  Patient: Linda Chandler  Procedure(s) Performed: Procedure(s) (LRB): DIAGNOSTIC LAPAROSCOPY WITH REMOVAL OF LEFT ECTOPIC PREGNANCY (Left)  Patient location during evaluation: PACU Anesthesia Type: General Level of consciousness: awake Pain management: pain level controlled Respiratory status: spontaneous breathing Cardiovascular status: blood pressure returned to baseline Anesthetic complications: no    Last Vitals:  Filed Vitals:   10/24/15 1638 10/24/15 1803  BP: 87/43 95/43  Pulse: 51 59  Temp:    Resp:      Last Pain:  Filed Vitals:   10/24/15 1803  PainSc: 6                  VAN STAVEREN,Jancie Kercher

## 2015-10-25 NOTE — Telephone Encounter (Signed)
Pt called and you had done a operation on her early Monday morning due to an eptopic pregnancy, and she wanted to know if its normal to have brusing in the vaginal area.

## 2015-10-26 NOTE — Telephone Encounter (Signed)
PT AWARE AND UNDERSTANDING °

## 2015-10-26 NOTE — Telephone Encounter (Signed)
Yes.  Please let her know that this is normal due to certain instruments used in the vagina for her surgery.  This should resolve spontaneously over the next few days.

## 2015-11-10 ENCOUNTER — Encounter: Payer: Self-pay | Admitting: Obstetrics and Gynecology

## 2015-11-10 ENCOUNTER — Ambulatory Visit (INDEPENDENT_AMBULATORY_CARE_PROVIDER_SITE_OTHER): Payer: Federal, State, Local not specified - PPO | Admitting: Obstetrics and Gynecology

## 2015-11-10 VITALS — BP 99/61 | HR 59 | Ht 68.0 in | Wt 134.4 lb

## 2015-11-10 DIAGNOSIS — D6489 Other specified anemias: Secondary | ICD-10-CM

## 2015-11-10 DIAGNOSIS — Z8759 Personal history of other complications of pregnancy, childbirth and the puerperium: Secondary | ICD-10-CM

## 2015-11-10 DIAGNOSIS — Z9889 Other specified postprocedural states: Secondary | ICD-10-CM

## 2015-11-11 LAB — HEMOGLOBIN AND HEMATOCRIT, BLOOD
HEMATOCRIT: 34 % (ref 34.0–46.6)
Hemoglobin: 11.5 g/dL (ref 11.1–15.9)

## 2015-11-14 ENCOUNTER — Telehealth: Payer: Self-pay

## 2015-11-14 NOTE — Telephone Encounter (Signed)
-----   Message from Hildred LaserAnika Cherry, MD sent at 11/12/2015  9:26 AM EDT ----- Please inform patient that her Hgb results have returned to normal and can discontinue use of the iron supplements.

## 2015-11-14 NOTE — Telephone Encounter (Signed)
Called pt informed her of information below. Pt gave verbal understanding.  

## 2015-11-14 NOTE — Progress Notes (Signed)
   GYNECOLOGY POST-OPERATIVE CLINIC PROGRESS NOTE  Subjective:     Linda Chandler is a 25 y.o. 662P1011 female who presents to the clinic 10 days status post laparoscopic left salpingectomy for left ectopic pregnancy. Eating a regular diet without difficulty. Bowel movements are normal. The patient is not having any pain.  The following portions of the patient's history were reviewed and updated as appropriate: allergies, current medications, past family history, past medical history, past social history, past surgical history and problem list.  Review of Systems A comprehensive review of systems was negative.    Objective:    BP 99/61 mmHg  Pulse 59  Ht 5\' 8"  (1.727 m)  Wt 134 lb 6.4 oz (60.963 kg)  BMI 20.44 kg/m2  LMP 07/19/2015 General:  alert and no distress  Abdomen: soft, bowel sounds active, non-tender  Incision:   healing well, no drainage, no erythema, no hernia, no seroma, no swelling, no dehiscence, incision well approximated.         Pathology (10/24/2015):  A. LEFT ECTOPIC AND FALLOPIAN TUBE; LEFT SALPINGECTOMY:  - FALLOPIAN TUBE WITH LUMINAL DECIDUA, CHORIONIC VILLI, HEMORRHAGE AND  FOCAL TRANSMURAL NECROSIS, CONSISTENT WITH RUPTURED ECTOPIC GESTATION.   Labs:  Results for Linda CoilHARNAGE, Emmanuella N (MRN [id number]) as of 11/14/2015 18:48  Ref. Range 10/24/2015 14:57  WBC Latest Ref Range: 3.6-11.0 K/uL 4.6  RBC Latest Ref Range: 3.80-5.20 MIL/uL 2.59 (L)  Hemoglobin Latest Ref Range: 12.0-16.0 g/dL 8.0 (L)  HCT Latest Ref Range: 35.0-47.0 % 23.8 (L)  MCV Latest Ref Range: 80.0-100.0 fL 91.8  MCH Latest Ref Range: 26.0-34.0 pg 30.8  MCHC Latest Ref Range: 32.0-36.0 g/dL 62.133.5  RDW Latest Ref Range: 11.5-14.5 % 12.7  Platelets Latest Ref Range: 150-440 K/uL 125 (L)   Assessment:    Doing well postoperatively.  Anemia due to acute blood loss  S/p left ectopic pregnancy with salpingectomy  Plan:    1. Continue any current medications as needed. 2. Wound care  discussed. Removed Liquiband dressing and placed steri-strips.  3. Activity restrictions: no bending, stooping, or squatting and no lifting more than 15 pounds for additional 4 days, then can resume all normal activities.  Can also resume sexual activity after 4 days, advised on use of barrier methods for 2-3 months until cycles become regular again prior to future attempts at conception.  4. Anticipated return to work: after 4 days. 5. Operative findings again reviewed. Pathology report discussed.  Will recheck Hgb for anemia.  Notes compliance with PO iron therapy.  6. Follow up: as needed    Hildred LaserAnika Geselle Cardosa, MD [facility]

## 2016-03-28 LAB — OB RESULTS CONSOLE GC/CHLAMYDIA
CHLAMYDIA, DNA PROBE: NEGATIVE
GC PROBE AMP, GENITAL: NEGATIVE

## 2016-05-30 LAB — OB RESULTS CONSOLE HIV ANTIBODY (ROUTINE TESTING): HIV: NONREACTIVE

## 2016-05-30 LAB — OB RESULTS CONSOLE ANTIBODY SCREEN: ANTIBODY SCREEN: NEGATIVE

## 2016-05-30 LAB — OB RESULTS CONSOLE ABO/RH: RH Type: POSITIVE

## 2016-05-30 LAB — OB RESULTS CONSOLE RUBELLA ANTIBODY, IGM: Rubella: IMMUNE

## 2016-05-30 LAB — OB RESULTS CONSOLE RPR: RPR: NONREACTIVE

## 2016-05-30 LAB — OB RESULTS CONSOLE HEPATITIS B SURFACE ANTIGEN: HEP B S AG: NEGATIVE

## 2016-06-20 ENCOUNTER — Inpatient Hospital Stay (HOSPITAL_COMMUNITY)
Admission: AD | Admit: 2016-06-20 | Discharge: 2016-06-20 | Disposition: A | Discharge status: Home / Self Care | Payer: Federal, State, Local not specified - PPO | Source: Ambulatory Visit | Attending: Obstetrics | Admitting: Obstetrics

## 2016-06-20 ENCOUNTER — Encounter (HOSPITAL_COMMUNITY): Payer: Self-pay | Admitting: *Deleted

## 2016-06-20 DIAGNOSIS — R109 Unspecified abdominal pain: Secondary | ICD-10-CM | POA: Diagnosis not present

## 2016-06-20 DIAGNOSIS — Z3A19 19 weeks gestation of pregnancy: Secondary | ICD-10-CM | POA: Diagnosis present

## 2016-06-20 DIAGNOSIS — O9989 Other specified diseases and conditions complicating pregnancy, childbirth and the puerperium: Secondary | ICD-10-CM

## 2016-06-20 DIAGNOSIS — Z9889 Other specified postprocedural states: Secondary | ICD-10-CM | POA: Insufficient documentation

## 2016-06-20 DIAGNOSIS — Z8249 Family history of ischemic heart disease and other diseases of the circulatory system: Secondary | ICD-10-CM | POA: Diagnosis not present

## 2016-06-20 DIAGNOSIS — Z79899 Other long term (current) drug therapy: Secondary | ICD-10-CM | POA: Insufficient documentation

## 2016-06-20 DIAGNOSIS — O26892 Other specified pregnancy related conditions, second trimester: Secondary | ICD-10-CM | POA: Diagnosis present

## 2016-06-20 DIAGNOSIS — O99282 Endocrine, nutritional and metabolic diseases complicating pregnancy, second trimester: Secondary | ICD-10-CM | POA: Insufficient documentation

## 2016-06-20 DIAGNOSIS — Z87891 Personal history of nicotine dependence: Secondary | ICD-10-CM | POA: Insufficient documentation

## 2016-06-20 DIAGNOSIS — O99512 Diseases of the respiratory system complicating pregnancy, second trimester: Secondary | ICD-10-CM | POA: Insufficient documentation

## 2016-06-20 LAB — URINALYSIS, ROUTINE W REFLEX MICROSCOPIC
BILIRUBIN URINE: NEGATIVE
Glucose, UA: NEGATIVE mg/dL
Hgb urine dipstick: NEGATIVE
Ketones, ur: NEGATIVE mg/dL
Leukocytes, UA: NEGATIVE
NITRITE: NEGATIVE
PROTEIN: NEGATIVE mg/dL
pH: 5.5 (ref 5.0–8.0)

## 2016-06-20 NOTE — MAU Note (Signed)
Pt reports severe lower abd pain for the last 8 hours, denies bleeding. Denies dysuria.

## 2016-06-20 NOTE — Discharge Instructions (Signed)
Round Ligament Pain Introduction The round ligament is a cord of muscle and tissue that helps to support the uterus. It can become a source of pain during pregnancy if it becomes stretched or twisted as the baby grows. The pain usually begins in the second trimester of pregnancy, and it can come and go until the baby is delivered. It is not a serious problem, and it does not cause harm to the baby. Round ligament pain is usually a short, sharp, and pinching pain, but it can also be a dull, lingering, and aching pain. The pain is felt in the lower side of the abdomen or in the groin. It usually starts deep in the groin and moves up to the outside of the hip area. Pain can occur with:  A sudden change in position.  Rolling over in bed.  Coughing or sneezing.  Physical activity. Follow these instructions at home: Watch your condition for any changes. Take these steps to help with your pain:  When the pain starts, relax. Then try:  Sitting down.  Flexing your knees up to your abdomen.  Lying on your side with one pillow under your abdomen and another pillow between your legs.  Sitting in a warm bath for 15-20 minutes or until the pain goes away.  Take over-the-counter and prescription medicines only as told by your health care provider.  Move slowly when you sit and stand.  Avoid long walks if they cause pain.  Stop or lessen your physical activities if they cause pain. Contact a health care provider if:  Your pain does not go away with treatment.  You feel pain in your back that you did not have before.  Your medicine is not helping. Get help right away if:  You develop a fever or chills.  You develop uterine contractions.  You develop vaginal bleeding.  You develop nausea or vomiting.  You develop diarrhea.  You have pain when you urinate. This information is not intended to replace advice given to you by your health care provider. Make sure you discuss any questions  you have with your health care provider. Document Released: 04/24/2008 Document Revised: 12/22/2015 Document Reviewed: 09/22/2014  2017 [employer]  

## 2016-06-20 NOTE — MAU Provider Note (Signed)
History     CSN: [id number]  Arrival date and time: 06/20/16 0020   None     No chief complaint on file.  Patient is a 25 year old G3 P1 and 19 week and 6 days who presents with acute onset of sharp abdominal pain bilaterally this evening. She says it starts on 5:00 and his sense tapered off significantly. She reports the pain is bilateral. It is reproducible with palpation. She reports regular fetal movement. Denies any vaginal bleeding. She states her last sexual intercourse was Saturday and Sunday. She reports the pain is nearly resolved at this time.    OB History    Gravida Para Term Preterm AB Living   3 1 1   1 1    SAB TAB Ectopic Multiple Live Births   1       1      Past Medical History:  Diagnosis Date  . Asthma     Past Surgical History:  Procedure Laterality Date  . DIAGNOSTIC LAPAROSCOPY WITH REMOVAL OF ECTOPIC PREGNANCY Left 10/24/2015   Procedure: DIAGNOSTIC LAPAROSCOPY WITH REMOVAL OF LEFT ECTOPIC PREGNANCY;  Surgeon: Hildred LaserAnika Cherry, MD;  Location: [facility] ORS;  Service: Gynecology;  Laterality: Left;    Family History  Problem Relation Age of Onset  . Hypertension Father     Social History  Substance Use Topics  . Smoking status: Former Smoker    Types: Cigarettes  . Smokeless tobacco: Never Used  . Alcohol use No     Comment: 07/04/15    Allergies: No Known Allergies  Prescriptions Prior to Admission  Medication Sig Dispense Refill Last Dose  . Prenatal Vit-Fe Fumarate-FA (PRENATAL MULTIVITAMIN) TABS tablet Take 1 tablet by mouth daily at 12 noon.   Past Week at Unknown time  . docusate sodium (COLACE) 100 MG capsule Take 1 capsule (100 mg total) by mouth 2 (two) times daily as needed for mild constipation. (Patient not taking: Reported on 11/10/2015) 30 capsule 2 Not Taking  . ferrous sulfate (FERROUSUL) 325 (65 FE) MG tablet Take 1 tablet (325 mg total) by mouth 2 (two) times daily. (Patient not taking: Reported on 11/10/2015) 60 tablet 1 Not Taking   . ibuprofen (ADVIL,MOTRIN) 800 MG tablet Take 1 tablet (800 mg total) by mouth every 8 (eight) hours as needed. (Patient not taking: Reported on 11/10/2015) 60 tablet 1 Not Taking  . oxyCODONE-acetaminophen (PERCOCET/ROXICET) 5-325 MG tablet Take 1-2 tablets by mouth every 6 (six) hours as needed for severe pain (moderate to severe pain (when tolerating fluids)). (Patient not taking: Reported on 11/10/2015) 30 tablet 0 Not Taking  . simethicone (GAS-X) 80 MG chewable tablet Chew 1 tablet (80 mg total) by mouth every 6 (six) hours as needed for flatulence (bloating). (Patient not taking: Reported on 11/10/2015) 30 tablet 0 Not Taking    Review of Systems  Constitutional: Negative for chills and fever.  HENT: Negative for congestion and hearing loss.   Eyes: Negative for blurred vision and double vision.  Respiratory: Negative for cough, sputum production and shortness of breath.   Cardiovascular: Negative for chest pain and palpitations.  Gastrointestinal: Positive for abdominal pain. Negative for constipation, diarrhea, heartburn, nausea and vomiting.  Genitourinary: Negative for dysuria, hematuria and urgency.  Musculoskeletal: Negative for myalgias.  Skin: Negative for rash.  Neurological: Negative for dizziness and headaches.  Endo/Heme/Allergies: Negative for environmental allergies. Does not bruise/bleed easily.  Psychiatric/Behavioral: Negative for depression and suicidal ideas.   Physical Exam   Blood pressure 111/64, pulse 69, temperature  97.8 F (36.6 C), temperature source Oral, resp. rate 16, height 5' 8.5" (1.74 m), weight 144 lb (65.3 kg), last menstrual period 07/19/2015, SpO2 99 %.  Physical Exam  Constitutional: She is oriented to person, place, and time. She appears well-developed and well-nourished.  HENT:  Head: Normocephalic and atraumatic.  Cardiovascular: Normal rate and intact distal pulses.   Respiratory: Effort normal and breath sounds normal. No respiratory  distress. She has no wheezes. She has no rales.  GI: Soft. Bowel sounds are normal. She exhibits no distension. There is no tenderness. There is no rebound and no guarding.  Musculoskeletal: Normal range of motion. She exhibits no edema.  Neurological: She is alert and oriented to person, place, and time. No cranial nerve deficit. Coordination normal.  Skin: Skin is warm and dry. No erythema.  Psychiatric: She has a normal mood and affect. Her behavior is normal.    MAU Course  Procedures  MDM In MAU patient underwent Doppler evaluation of fetal heart tones which were within normal limits. She underwent physical exam which revealed pain consistent with round ligament pain. Patient's urine showed she was dehydrated. No other concerns at this time.  Assessment and Plan  #1: Dehydration advised 6-8 bottles of water daily.  #2: Round ligament pain. Advised Tylenol when necessary. Advised pregnancy support belt can also help with symptoms. Patient voices understanding is okay with discharge home.    Linda Chandler 06/20/2016, 2:26 AM

## 2016-07-30 NOTE — L&D Delivery Note (Signed)
Delivery Note At 11:48 PM a viable and healthy female was delivered via Vaginal, Spontaneous Delivery (Presentation:LOA ; vtx ).  APGAR: 9, 9; weight 7 lb 5.2 oz (3323 g).   Placenta status:spontaneous intact not sent, .  Cord: CAN x 1 reducible  with the following complications: .  Cord YN:WGNF  Anesthesia:  epidural Episiotomy: None Lacerations: Periurethral Suture Repair: n/a Est. Blood Loss (mL): 100  Mom to postpartum.  Baby to Couplet care / Skin to Skin.  Bynum Mccullars A 11/07/2016, 1:33 AM

## 2016-09-10 IMAGING — US US OB TRANSVAGINAL
1 series · 14 of 28 positions shown · non-contrast
Comparison: None.

CLINICAL DATA: Miscarriage 3 weeks ago with persistent vaginal
bleeding. RIGHT lower quadrant pain for 3 hours. Beta HCG 547

EXAM:
OBSTETRIC <14 WK ULTRASOUND
TECHNIQUE: Transabdominal ultrasound was performed for evaluation of the
gestation as well as the maternal uterus and adnexal regions.

[Series 1: us ob transvaginal · 0.20mm/px · 14 of 128 slices shown]
[im 5/128]
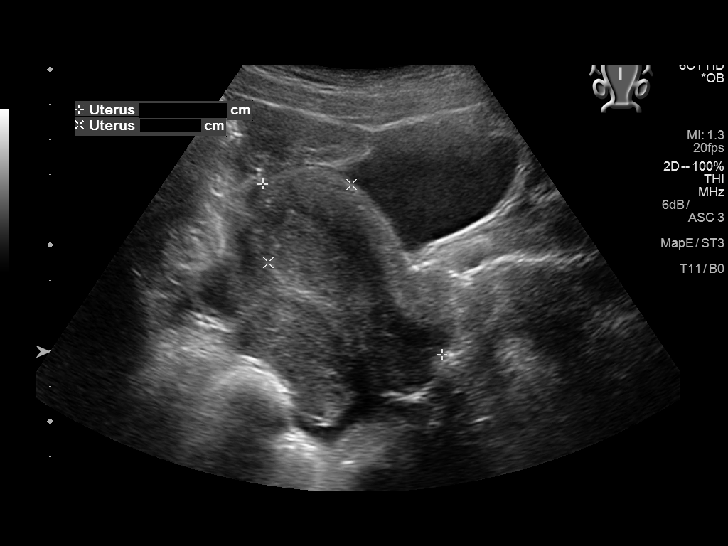
[im 15/128]
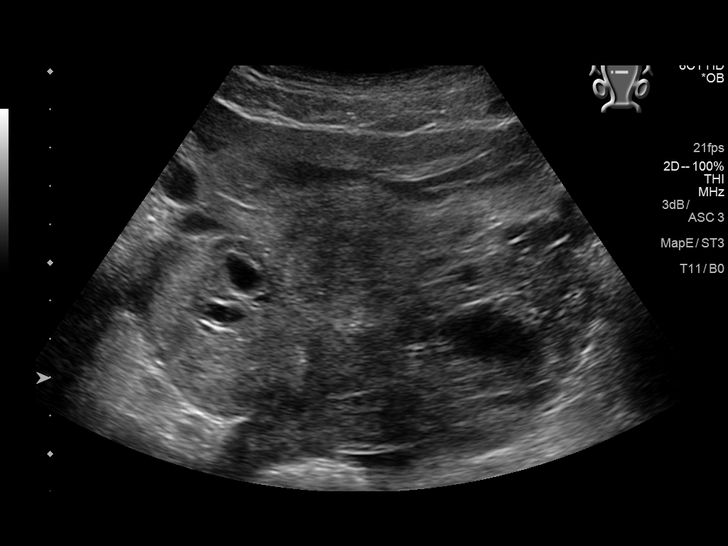
[im 24/128]
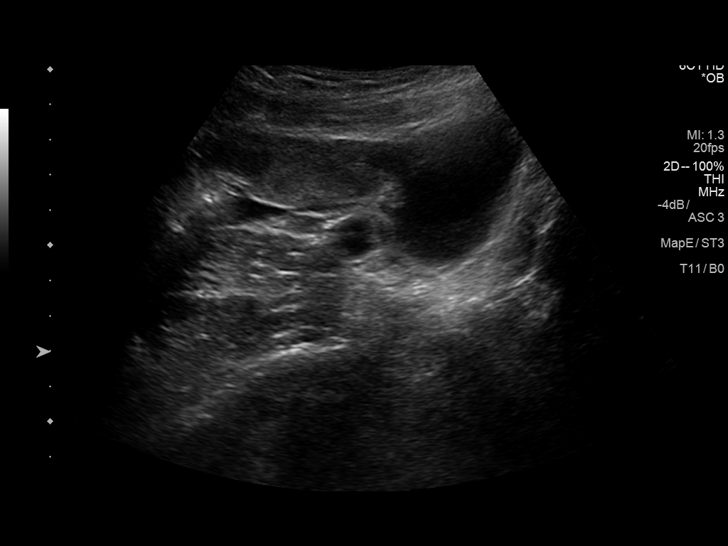
[im 33/128]
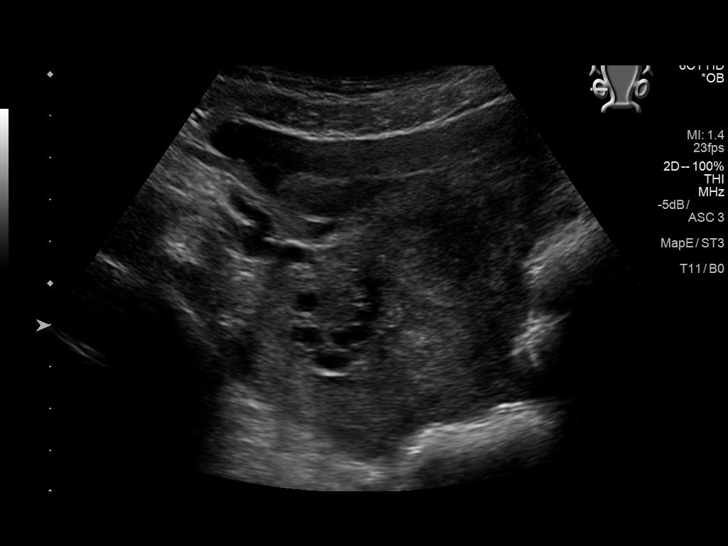
[im 43/128]
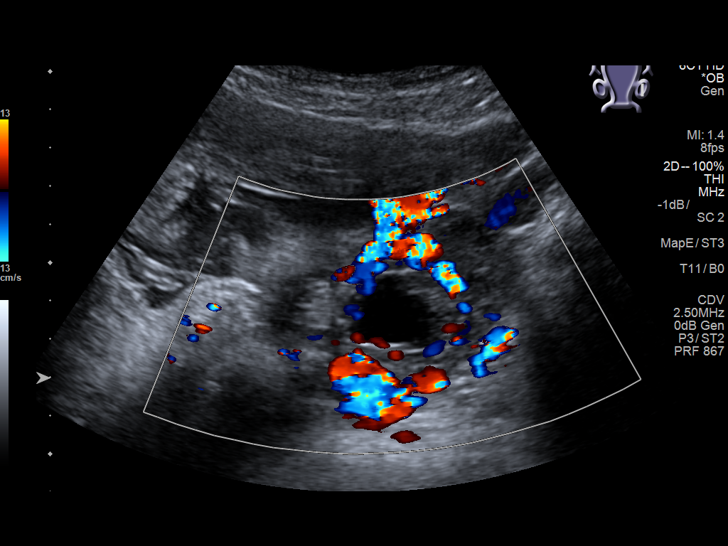
[im 52/128]
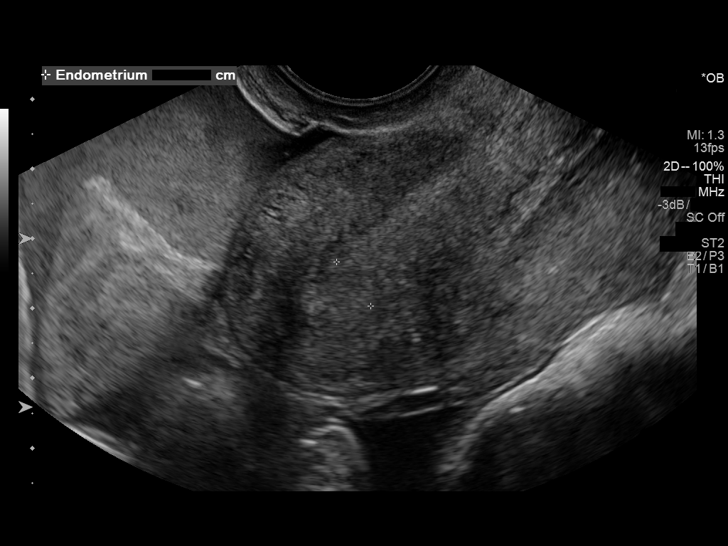
[im 62/128]
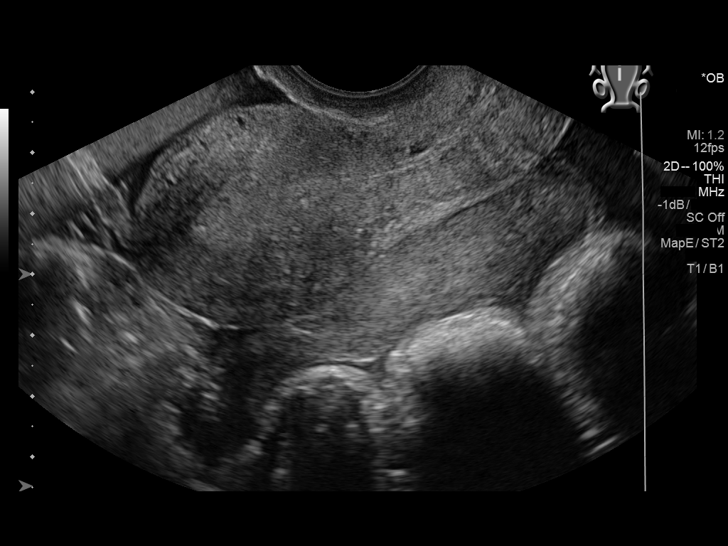
[im 71/128]
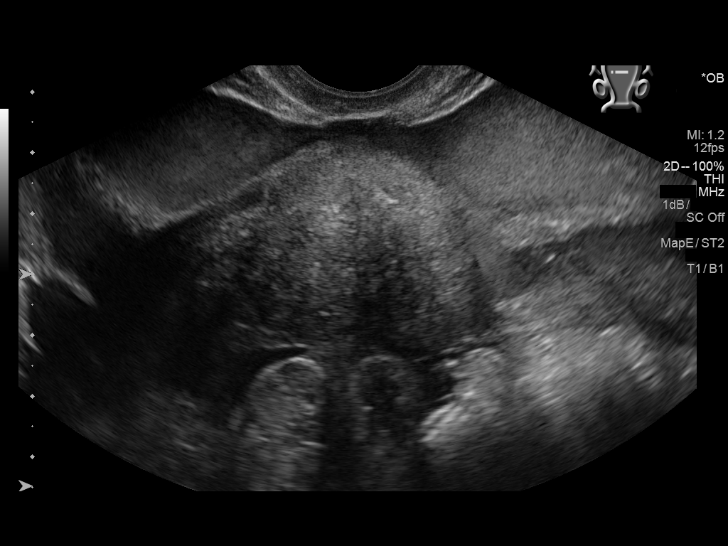
[im 80/128]
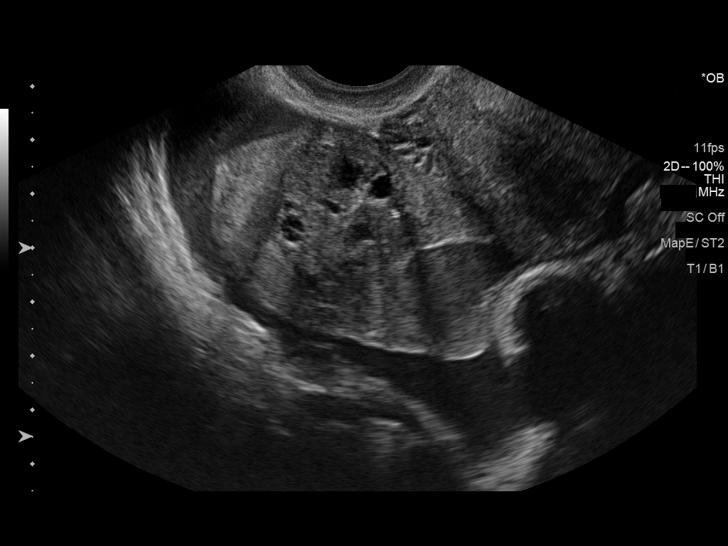
[im 90/128]
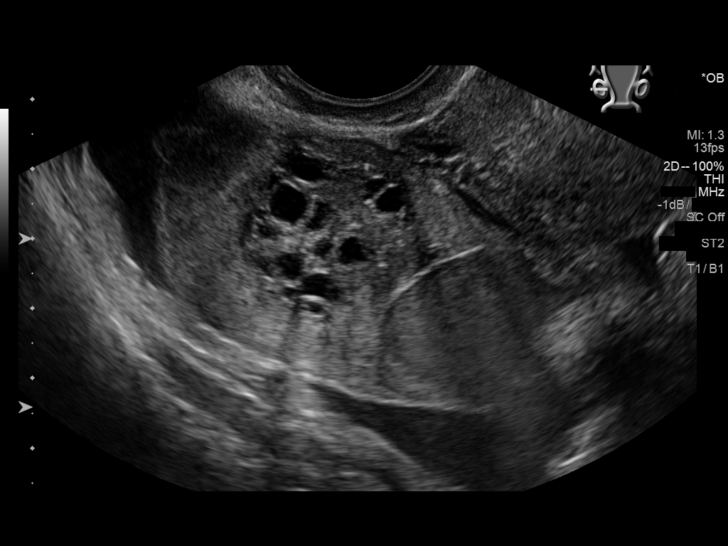
[im 99/128]
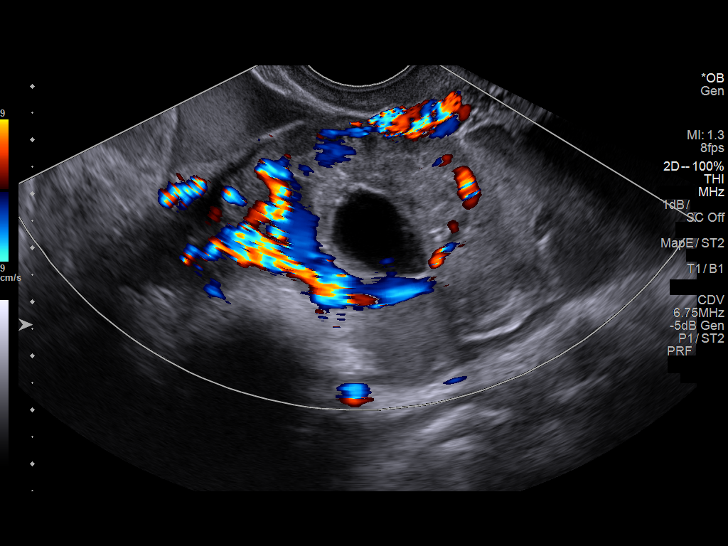
[im 109/128]
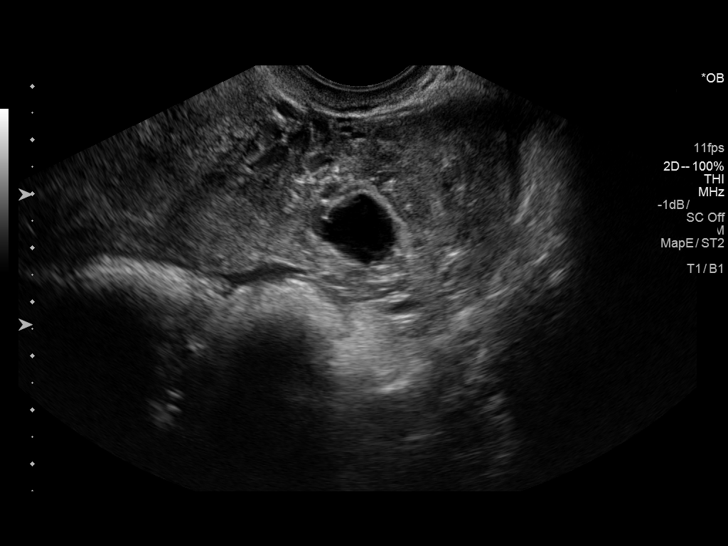
[im 118/128]
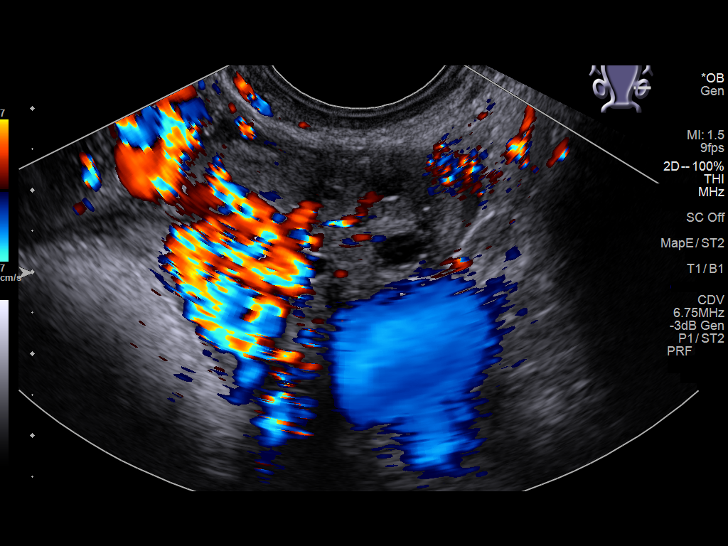
[im 128/128]
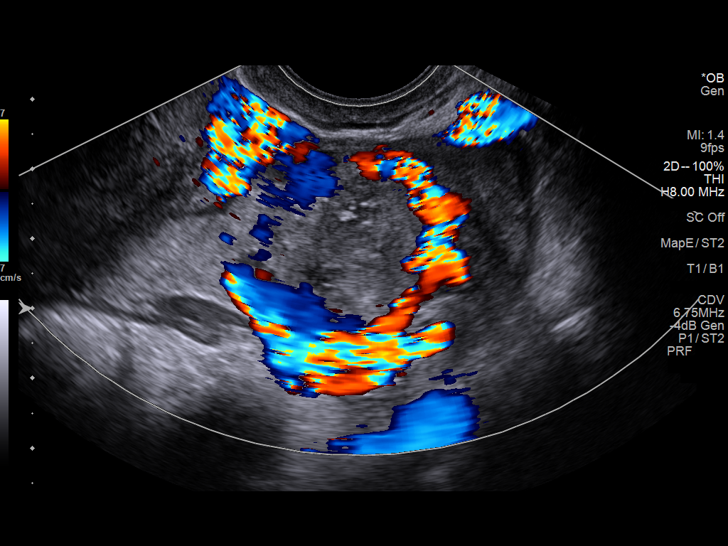

[14 of 28 positions shown; findings below may reference images not displayed]

FINDINGS: Intrauterine gestational sac: Not present.

Yolk sac:  Not present.

Embryo:  Not present.

Cardiac Activity: Not present.

Maternal uterus/adnexae: Large amount of echogenic free fluid in the
pelvis. RIGHT ovary is 3 x 2.3 x 2.6 cm, normal in appearance. LEFT
para ovarian 4.7 x 3.9 x 4.6 cm echogenic hypervascular mass with
central anechoic component which is nonspecific though could
represent empty gestational sac.

8 mm homogeneous avascular endometrium. Normal appearance of the
uterus.
IMPRESSION: No sonographic findings of IUP.

Large amount of probable hemo peritoneum in the pelvis. LEFT adnexal
4.7 x 3.9 x 4.6 cm hypervascular mass concerning for ruptured
ectopic pregnancy.

Acute findings discussed with and reconfirmed by Dr.HTOO ZOU
on 10/24/2015 at [DATE].

## 2016-10-10 LAB — OB RESULTS CONSOLE GBS: GBS: NEGATIVE

## 2016-11-06 ENCOUNTER — Encounter (HOSPITAL_COMMUNITY): Payer: Self-pay | Admitting: *Deleted

## 2016-11-06 ENCOUNTER — Inpatient Hospital Stay (HOSPITAL_COMMUNITY)
Admission: AD | Admit: 2016-11-06 | Discharge: 2016-11-08 | DRG: 775 | Disposition: A | Discharge status: Home / Self Care | Payer: Federal, State, Local not specified - PPO | Source: Ambulatory Visit | Attending: Obstetrics and Gynecology | Admitting: Obstetrics and Gynecology

## 2016-11-06 ENCOUNTER — Inpatient Hospital Stay (HOSPITAL_COMMUNITY): Payer: Federal, State, Local not specified - PPO | Admitting: Anesthesiology

## 2016-11-06 DIAGNOSIS — Z8249 Family history of ischemic heart disease and other diseases of the circulatory system: Secondary | ICD-10-CM | POA: Diagnosis not present

## 2016-11-06 DIAGNOSIS — O9952 Diseases of the respiratory system complicating childbirth: Secondary | ICD-10-CM | POA: Diagnosis present

## 2016-11-06 DIAGNOSIS — Z3A39 39 weeks gestation of pregnancy: Secondary | ICD-10-CM

## 2016-11-06 DIAGNOSIS — Z87891 Personal history of nicotine dependence: Secondary | ICD-10-CM | POA: Diagnosis not present

## 2016-11-06 DIAGNOSIS — Z3493 Encounter for supervision of normal pregnancy, unspecified, third trimester: Secondary | ICD-10-CM | POA: Diagnosis present

## 2016-11-06 DIAGNOSIS — J45909 Unspecified asthma, uncomplicated: Secondary | ICD-10-CM | POA: Diagnosis present

## 2016-11-06 LAB — CBC
HCT: 33.6 % — ABNORMAL LOW (ref 36.0–46.0)
HEMOGLOBIN: 10.8 g/dL — AB (ref 12.0–15.0)
MCH: 27.9 pg (ref 26.0–34.0)
MCHC: 32.1 g/dL (ref 30.0–36.0)
MCV: 86.8 fL (ref 78.0–100.0)
Platelets: 222 10*3/uL (ref 150–400)
RBC: 3.87 MIL/uL (ref 3.87–5.11)
RDW: 19.7 % — ABNORMAL HIGH (ref 11.5–15.5)
WBC: 13.5 10*3/uL — ABNORMAL HIGH (ref 4.0–10.5)

## 2016-11-06 LAB — TYPE AND SCREEN
ABO/RH(D): O POS
Antibody Screen: NEGATIVE

## 2016-11-06 MED ORDER — OXYTOCIN BOLUS FROM INFUSION
500.0000 mL | Freq: Once | INTRAVENOUS | Status: AC
  Administered 2016-11-06: 500 mL via INTRAVENOUS

## 2016-11-06 MED ORDER — LACTATED RINGERS IV SOLN
INTRAVENOUS | Status: DC
  Administered 2016-11-06: 22:00:00 via INTRAUTERINE

## 2016-11-06 MED ORDER — EPHEDRINE 5 MG/ML INJ
10.0000 mg | INTRAVENOUS | Status: DC | PRN
  Filled 2016-11-06: qty 2

## 2016-11-06 MED ORDER — PHENYLEPHRINE 40 MCG/ML (10ML) SYRINGE FOR IV PUSH (FOR BLOOD PRESSURE SUPPORT)
80.0000 ug | PREFILLED_SYRINGE | INTRAVENOUS | Status: DC | PRN
  Filled 2016-11-06: qty 5

## 2016-11-06 MED ORDER — PHENYLEPHRINE 40 MCG/ML (10ML) SYRINGE FOR IV PUSH (FOR BLOOD PRESSURE SUPPORT)
80.0000 ug | PREFILLED_SYRINGE | INTRAVENOUS | Status: DC | PRN
  Filled 2016-11-06: qty 5
  Filled 2016-11-06: qty 10

## 2016-11-06 MED ORDER — OXYTOCIN 40 UNITS IN LACTATED RINGERS INFUSION - SIMPLE MED: 1.0000 m[IU]/min | INTRAVENOUS | Status: DC

## 2016-11-06 MED ORDER — LACTATED RINGERS IV SOLN
500.0000 mL | Freq: Once | INTRAVENOUS | Status: AC
  Administered 2016-11-06: 500 mL via INTRAVENOUS

## 2016-11-06 MED ORDER — SOD CITRATE-CITRIC ACID 500-334 MG/5ML PO SOLN: 30.0000 mL | ORAL | Status: DC | PRN

## 2016-11-06 MED ORDER — OXYCODONE-ACETAMINOPHEN 5-325 MG PO TABS: 2.0000 | ORAL_TABLET | ORAL | Status: DC | PRN

## 2016-11-06 MED ORDER — ACETAMINOPHEN 325 MG PO TABS: 650.0000 mg | ORAL_TABLET | ORAL | Status: DC | PRN

## 2016-11-06 MED ORDER — DIPHENHYDRAMINE HCL 50 MG/ML IJ SOLN: 12.5000 mg | INTRAMUSCULAR | Status: DC | PRN

## 2016-11-06 MED ORDER — LACTATED RINGERS IV SOLN: 500.0000 mL | INTRAVENOUS | Status: DC | PRN

## 2016-11-06 MED ORDER — ONDANSETRON HCL 4 MG/2ML IJ SOLN: 4.0000 mg | Freq: Four times a day (QID) | INTRAMUSCULAR | Status: DC | PRN

## 2016-11-06 MED ORDER — LIDOCAINE HCL (PF) 1 % IJ SOLN
30.0000 mL | INTRAMUSCULAR | Status: DC | PRN
  Filled 2016-11-06: qty 30

## 2016-11-06 MED ORDER — LIDOCAINE HCL (PF) 1 % IJ SOLN
INTRAMUSCULAR | Status: DC | PRN
  Administered 2016-11-06 (×2): 5 mL

## 2016-11-06 MED ORDER — OXYTOCIN 40 UNITS IN LACTATED RINGERS INFUSION - SIMPLE MED
2.5000 [IU]/h | INTRAVENOUS | Status: DC
  Administered 2016-11-07: 2.5 [IU]/h via INTRAVENOUS
  Filled 2016-11-06: qty 1000

## 2016-11-06 MED ORDER — OXYTOCIN 10 UNIT/ML IJ SOLN: 10.0000 [IU] | Freq: Once | INTRAMUSCULAR | Status: DC

## 2016-11-06 MED ORDER — LACTATED RINGERS IV SOLN
INTRAVENOUS | Status: DC
  Administered 2016-11-06: 19:00:00 via INTRAVENOUS

## 2016-11-06 MED ORDER — TERBUTALINE SULFATE 1 MG/ML IJ SOLN
0.2500 mg | Freq: Once | INTRAMUSCULAR | Status: DC | PRN
  Filled 2016-11-06: qty 1

## 2016-11-06 MED ORDER — FENTANYL 2.5 MCG/ML BUPIVACAINE 1/10 % EPIDURAL INFUSION (WH - ANES)
14.0000 mL/h | INTRAMUSCULAR | Status: DC | PRN
  Administered 2016-11-06: 14 mL/h via EPIDURAL
  Filled 2016-11-06: qty 100

## 2016-11-06 MED ORDER — OXYCODONE-ACETAMINOPHEN 5-325 MG PO TABS: 1.0000 | ORAL_TABLET | ORAL | Status: DC | PRN

## 2016-11-06 NOTE — Progress Notes (Signed)
S; pushing Vomited twice O: BP 124/79   Pulse 78   Temp 98.1 F (36.7 C) (Oral)   Resp 17   Ht 5' 8.5" (1.74 m)   Wt 75.8 kg (167 lb)   LMP 07/19/2015   SpO2 99%   BMI 25.02 kg/m  VE fullY +2 station Tracing: baseline 140 (+) variable decels with ctx Ctx q 1 12-  IMP: Variable decel due to cord compression. Amnioinfusion ongoing Term gestation P) cont pushing

## 2016-11-06 NOTE — Anesthesia Preprocedure Evaluation (Signed)
Anesthesia Evaluation  Patient identified by MRN, date of birth, ID band Patient awake    Reviewed: Allergy & Precautions, H&P , NPO status , Patient's Chart, lab work & pertinent test results  History of Anesthesia Complications Negative for: history of anesthetic complications  Airway Mallampati: II  TM Distance: >3 FB Neck ROM: full    Dental no notable dental hx. (+) Teeth Intact   Pulmonary asthma , former smoker,    Pulmonary exam normal breath sounds clear to auscultation       Cardiovascular negative cardio ROS Normal cardiovascular exam Rhythm:regular Rate:Normal     Neuro/Psych negative neurological ROS  negative psych ROS   GI/Hepatic negative GI ROS, Neg liver ROS,   Endo/Other  negative endocrine ROS  Renal/GU negative Renal ROS  negative genitourinary   Musculoskeletal   Abdominal   Peds  Hematology negative hematology ROS (+)   Anesthesia Other Findings   Reproductive/Obstetrics (+) Pregnancy                             Anesthesia Physical Anesthesia Plan  ASA: II  Anesthesia Plan: Epidural   Post-op Pain Management:    Induction:   Airway Management Planned:   Additional Equipment:   Intra-op Plan:   Post-operative Plan:   Informed Consent: I have reviewed the patients History and Physical, chart, labs and discussed the procedure including the risks, benefits and alternatives for the proposed anesthesia with the patient or authorized representative who has indicated his/her understanding and acceptance.     Plan Discussed with:   Anesthesia Plan Comments:         Anesthesia Quick Evaluation  

## 2016-11-06 NOTE — Progress Notes (Signed)
Linda Chandler is a 26 y.o. G3P1011 at [redacted]w[redacted]d by ultrasound admitted for active labor  Subjective: Chief Complaint  Patient presents with  . Labor Eval   Comfortable  Epidural Objective: BP 114/71   Pulse 71   Temp 98.1 F (36.7 C) (Oral)   Resp 17   Ht 5' 8.5" (1.74 m)   Wt 75.8 kg (167 lb)   LMP 07/19/2015   SpO2 99%   BMI 25.02 kg/m  No intake/output data recorded. No intake/output data recorded.  FHT:  FHR: 140 bpm, variability: moderate,  accelerations:  Present,  decelerations:  Present variable and   UC:   Ctx q 2 mins SVE:  5/100/-1 LOA asynclitic Arom clear fluid. IUPC/ISE placed  Labs: Lab Results  Component Value Date   WBC 13.5 (H) 11/06/2016   HGB 10.8 (L) 11/06/2016   HCT 33.6 (L) 11/06/2016   MCV 86.8 11/06/2016   PLT 222 11/06/2016    Assessment / Plan: Protracted active phase Term gestation P) amnioinfusion. Pitocin prn. Right exaggerated sims position  Anticipated MOD:  NSVD  Linda Chandler A 11/06/2016, 10:18 PM

## 2016-11-06 NOTE — MAU Note (Signed)
Contractions started at 0500, now q 3-5 min.  No leaking.  Small amt of bloody mucous.  Was 2-3 when last checked. No problems with preg

## 2016-11-06 NOTE — Anesthesia Procedure Notes (Signed)
Epidural Patient location during procedure: OB  Staffing Anesthesiologist: Ajdin Macke Performed: anesthesiologist   Preanesthetic Checklist Completed: patient identified, site marked, surgical consent, pre-op evaluation, timeout performed, IV checked, risks and benefits discussed and monitors and equipment checked  Epidural Patient position: sitting Prep: DuraPrep Patient monitoring: heart rate, continuous pulse ox and blood pressure Approach: right paramedian Location: L3-L4 Injection technique: LOR saline  Needle:  Needle type: Tuohy  Needle gauge: 17 G Needle length: 9 cm and 9 Needle insertion depth: 6 cm Catheter type: closed end flexible Catheter size: 20 Guage Catheter at skin depth: 10 cm Test dose: negative  Assessment Events: blood not aspirated, injection not painful, no injection resistance, negative IV test and no paresthesia  Additional Notes Patient identified. Risks/Benefits/Options discussed with patient including but not limited to bleeding, infection, nerve damage, paralysis, failed block, incomplete pain control, headache, blood pressure changes, nausea, vomiting, reactions to medication both or allergic, itching and postpartum back pain. Confirmed with bedside nurse the patient's most recent platelet count. Confirmed with patient that they are not currently taking any anticoagulation, have any bleeding history or any family history of bleeding disorders. Patient expressed understanding and wished to proceed. All questions were answered. Sterile technique was used throughout the entire procedure. Please see nursing notes for vital signs. Test dose was given through epidural needle and negative prior to continuing to dose epidural or start infusion. Warning signs of high block given to the patient including shortness of breath, tingling/numbness in hands, complete motor block, or any concerning symptoms with instructions to call for help. Patient was given  instructions on fall risk and not to get out of bed. All questions and concerns addressed with instructions to call with any issues.     

## 2016-11-06 NOTE — H&P (Signed)
Linda Chandler is a 26 y.o. female presenting @ term gestation in labor. Intact membrane. GBS cx (-). OB History    Gravida Para Term Preterm AB Living   SAB TAB Ectopic Multiple Live Births   1       1     Past Medical History:  Diagnosis Date  . Asthma    Past Surgical History:  Procedure Laterality Date  . DIAGNOSTIC LAPAROSCOPY WITH REMOVAL OF ECTOPIC PREGNANCY Left 10/24/2015   Procedure: DIAGNOSTIC LAPAROSCOPY WITH REMOVAL OF LEFT ECTOPIC PREGNANCY;  Surgeon: Hildred Laser, MD;  Location: [facility] ORS;  Service: Gynecology;  Laterality: Left;   Family History: family history includes Hypertension in her father. Social History:  reports that she has quit smoking. Her smoking use included Cigarettes. She has never used smokeless tobacco. She reports that she does not drink alcohol or use drugs.     Maternal Diabetes: No Genetic Screening: Normal Maternal Ultrasounds/Referrals: Normal Fetal Ultrasounds or other Referrals:  None Maternal Substance Abuse:  No Significant Maternal Medications:  None Significant Maternal Lab Results:  Lab values include: Group B Strep negative Other Comments:  None  Review of Systems  All other systems reviewed and are negative.  History Dilation: 4 Effacement (%): 90 Station: -1 Exam by:: Weston,RN Last menstrual period 07/19/2015. Exam Physical Exam  Constitutional: She is oriented to person, place, and time. She appears well-developed and well-nourished.  HENT:  Head: Atraumatic.  Eyes: EOM are normal.  Neck: Neck supple.  Cardiovascular: Regular rhythm.   Respiratory: Breath sounds normal.  GI: Soft.  Musculoskeletal: She exhibits edema.  Neurological: She is alert and oriented to person, place, and time.  Skin: Skin is warm and dry.    Prenatal labs: ABO, Rh: O/Positive/-- (11/01 0000) Antibody: Negative (11/01 0000) Rubella: Immune (11/01 0000) RPR: Nonreactive (11/01 0000)  HBsAg: Negative (11/01 0000)   HIV: Non-reactive (11/01 0000)  GBS: Negative (03/14 0000)   Assessment/Plan: Labor Term gestation P) admit routine labs, epidural prn. Amniotomy prn   Martha Ellerby A 11/06/2016, 6:41 PM

## 2016-11-07 ENCOUNTER — Encounter (HOSPITAL_COMMUNITY): Payer: Self-pay

## 2016-11-07 LAB — CBC
HEMATOCRIT: 31.8 % — AB (ref 36.0–46.0)
HEMOGLOBIN: 10.2 g/dL — AB (ref 12.0–15.0)
MCH: 27.9 pg (ref 26.0–34.0)
MCHC: 32.1 g/dL (ref 30.0–36.0)
MCV: 87.1 fL (ref 78.0–100.0)
Platelets: 200 10*3/uL (ref 150–400)
RBC: 3.65 MIL/uL — AB (ref 3.87–5.11)
RDW: 19.9 % — ABNORMAL HIGH (ref 11.5–15.5)
WBC: 17 10*3/uL — AB (ref 4.0–10.5)

## 2016-11-07 LAB — COMPREHENSIVE METABOLIC PANEL
ALK PHOS: 187 U/L — AB (ref 38–126)
ALT: 15 U/L (ref 14–54)
ANION GAP: 8 (ref 5–15)
AST: 24 U/L (ref 15–41)
Albumin: 2.7 g/dL — ABNORMAL LOW (ref 3.5–5.0)
BILIRUBIN TOTAL: 0.5 mg/dL (ref 0.3–1.2)
BUN: 6 mg/dL (ref 6–20)
CALCIUM: 8.8 mg/dL — AB (ref 8.9–10.3)
CO2: 23 mmol/L (ref 22–32)
CREATININE: 0.49 mg/dL (ref 0.44–1.00)
Chloride: 103 mmol/L (ref 101–111)
GFR calc non Af Amer: >60 mL/min (ref >60)
Glucose, Bld: 143 mg/dL — ABNORMAL HIGH (ref 65–99)
Potassium: 3.4 mmol/L — ABNORMAL LOW (ref 3.5–5.1)
Sodium: 134 mmol/L — ABNORMAL LOW (ref 135–145)
TOTAL PROTEIN: 5.9 g/dL — AB (ref 6.5–8.1)

## 2016-11-07 LAB — RPR: RPR: NONREACTIVE

## 2016-11-07 LAB — URIC ACID: Uric Acid, Serum: 3 mg/dL (ref 2.3–6.6)

## 2016-11-07 LAB — ABO/RH: ABO/RH(D): O POS

## 2016-11-07 MED ORDER — PRENATAL MULTIVITAMIN CH
1.0000 | ORAL_TABLET | Freq: Every day | ORAL | Status: DC
  Administered 2016-11-07 – 2016-11-08 (×2): 1 via ORAL
  Filled 2016-11-07 (×2): qty 1

## 2016-11-07 MED ORDER — COCONUT OIL OIL: 1.0000 "application " | TOPICAL_OIL | Status: DC | PRN

## 2016-11-07 MED ORDER — ACETAMINOPHEN 325 MG PO TABS: 650.0000 mg | ORAL_TABLET | ORAL | Status: DC | PRN

## 2016-11-07 MED ORDER — DIPHENHYDRAMINE HCL 25 MG PO CAPS: 25.0000 mg | ORAL_CAPSULE | Freq: Four times a day (QID) | ORAL | Status: DC | PRN

## 2016-11-07 MED ORDER — WITCH HAZEL-GLYCERIN EX PADS: 1.0000 "application " | MEDICATED_PAD | CUTANEOUS | Status: DC | PRN

## 2016-11-07 MED ORDER — ZOLPIDEM TARTRATE 5 MG PO TABS: 5.0000 mg | ORAL_TABLET | Freq: Every evening | ORAL | Status: DC | PRN

## 2016-11-07 MED ORDER — DIBUCAINE 1 % RE OINT: 1.0000 "application " | TOPICAL_OINTMENT | RECTAL | Status: DC | PRN

## 2016-11-07 MED ORDER — FERROUS SULFATE 325 (65 FE) MG PO TABS: 325.0000 mg | ORAL_TABLET | Freq: Two times a day (BID) | ORAL | Status: DC

## 2016-11-07 MED ORDER — SENNOSIDES-DOCUSATE SODIUM 8.6-50 MG PO TABS
2.0000 | ORAL_TABLET | ORAL | Status: DC
  Administered 2016-11-08: 2 via ORAL
  Filled 2016-11-07: qty 2

## 2016-11-07 MED ORDER — BENZOCAINE-MENTHOL 20-0.5 % EX AERO: 1.0000 "application " | INHALATION_SPRAY | CUTANEOUS | Status: DC | PRN

## 2016-11-07 MED ORDER — IBUPROFEN 600 MG PO TABS
600.0000 mg | ORAL_TABLET | Freq: Four times a day (QID) | ORAL | Status: DC
  Administered 2016-11-07 – 2016-11-08 (×6): 600 mg via ORAL
  Filled 2016-11-07 (×6): qty 1

## 2016-11-07 MED ORDER — ONDANSETRON HCL 4 MG/2ML IJ SOLN: 4.0000 mg | INTRAMUSCULAR | Status: DC | PRN

## 2016-11-07 MED ORDER — ONDANSETRON HCL 4 MG PO TABS: 4.0000 mg | ORAL_TABLET | ORAL | Status: DC | PRN

## 2016-11-07 MED ORDER — SIMETHICONE 80 MG PO CHEW: 80.0000 mg | CHEWABLE_TABLET | ORAL | Status: DC | PRN

## 2016-11-07 NOTE — Anesthesia Postprocedure Evaluation (Signed)
Anesthesia Post Note  Patient: Linda Chandler  Procedure(s) Performed: * No procedures listed *  Patient location during evaluation: Mother Baby Anesthesia Type: Epidural Level of consciousness: awake and alert and oriented Pain management: pain level controlled Vital Signs Assessment: post-procedure vital signs reviewed and stable Respiratory status: spontaneous breathing and nonlabored ventilation Cardiovascular status: stable Postop Assessment: no headache, no backache, epidural receding, patient able to bend at knees, no signs of nausea or vomiting and adequate PO intake Anesthetic complications: no        Last Vitals:  Vitals:   11/07/16 0251 11/07/16 0632  BP: (!) 118/56 (!) 119/52  Pulse: 68 (!) 57  Resp: 16 18  Temp: 36.9 C 36.4 C    Last Pain:  Vitals:   11/07/16 0635  TempSrc:   PainSc: 4    Pain Goal:                 Laban Emperor

## 2016-11-07 NOTE — Progress Notes (Signed)
PPD 1 SVD  S:  Reports feeling well, a little tired              Tolerating po/ No nausea or vomiting             Bleeding is moderate             Pain controlled with ibuprofen (OTC)             Up ad lib / ambulatory / voiding QS  Newborn breast feeding- latching well  O:               VS: BP (!) 119/52 (BP Location: Right Arm)   Pulse (!) 57   Temp 97.6 F (36.4 C) (Oral)   Resp 18   Ht 5' 8.5" (1.74 m)   Wt 75.8 kg (167 lb)   LMP 07/19/2015   SpO2 99%   Breastfeeding? Unknown   BMI 25.02 kg/m    LABS:              Recent Labs  11/06/16 1905 11/07/16 0208  WBC 13.5* 17.0*  HGB 10.8* 10.2*  PLT 222 200               Blood type: --/--/O POS, O POS (04/10 1905)  Rubella: Immune (11/01 0000)                     I&O: Intake/Output      04/10 0701 - 04/11 0700 04/11 0701 - 04/12 0700   Urine (mL/kg/hr) 1700    Blood 100    Total Output 1800     Net -1800          Urine Occurrence 1 x                  Physical Exam:             Alert and oriented X3  Abdomen: soft, non-tender, non-distended              Fundus: firm, non-tender, U/1   Perineum: periurethral laceration healing well   Lochia: moderate   Extremities: no edema, no calf pain or tenderness    A: PPD # 1   Doing well - stable status  P: Routine post partum orders  DC home tomorrow   Steward Drone BSN, SNM 11/07/2016, 9:48 AM

## 2016-11-08 MED ORDER — IBUPROFEN 600 MG PO TABS: 600.0000 mg | ORAL_TABLET | Freq: Four times a day (QID) | ORAL | 0 refills | Status: AC

## 2016-11-08 NOTE — Discharge Summary (Signed)
Obstetric Discharge Summary Reason for Admission: onset of labor Prenatal Procedures: none Intrapartum Procedures: spontaneous vaginal delivery Postpartum Procedures: none Complications-Operative and Postpartum:none Hemoglobin  Date Value Ref Range Status  11/07/2016 10.2 (L) 12.0 - 15.0 g/dL Final   HCT  Date Value Ref Range Status  11/07/2016 31.8 (L) 36.0 - 46.0 % Final   Hematocrit  Date Value Ref Range Status  11/10/2015 34.0 34.0 - 46.6 % Final    Physical Exam:  General: alert, cooperative and no distress Lochia: appropriate Uterine Fundus: firm Perineum: intact DVT Evaluation: No evidence of DVT seen on physical exam.  Discharge Diagnoses: Term Pregnancy-delivered  Discharge Information: Date: 11/08/2016 Activity: pelvic rest Diet: routine Medications: PNV and Ibuprofen Condition: stable Instructions: refer to practice specific booklet Discharge to: home Follow-up Information    COUSINS,SHERONETTE A, MD. Schedule an appointment as soon as possible for a visit in 6 week(s).   Specialty:  Obstetrics and Gynecology Contact information: [address] [place] [address] [phone]           Newborn Data: Live born female  Birth Weight: 7 lb 5.2 oz (3323 g) APGAR: 9, 9  Home with mother.  Marlinda Mike 11/08/2016, 12:55 PM

## 2016-11-08 NOTE — Progress Notes (Signed)
PPD 2 SVD / no repair  S:  Reports feeling well - ready to go home             Tolerating po/ No nausea or vomiting             Bleeding is light             Pain controlled with motrin             Up ad lib / ambulatory / voiding QS  Newborn breast feeding    O:               VS: BP 117/67 (BP Location: Right Arm)   Pulse (!) 49   Temp 97.4 F (36.3 C) (Oral)   Resp 18   Ht 5' 8.5" (1.74 m)   Wt 75.8 kg (167 lb)   LMP 07/19/2015   SpO2 99%   Breastfeeding? Unknown   BMI 25.02 kg/m    LABS:              Recent Labs  11/06/16 1905 11/07/16 0208  WBC 13.5* 17.0*  HGB 10.8* 10.2*  PLT 222 200               Blood type: --/--/O POS, O POS (04/10 1905)  Rubella: Immune (11/01 0000)                         Physical Exam:             Alert and oriented X3  Abdomen: soft, non-tender, non-distended              Fundus: firm, non-tender, U-1  Perineum: intact  Lochia: light  Extremities: no edema, no calf pain or tenderness    A: PPD # 2   Doing well - stable status  P: Routine post partum orders  DC home - WOB booklet - instructions reviewed  Marlinda Mike CNM, MSN, FACNM 11/08/2016, 8:11 AM

## 2016-11-08 NOTE — Lactation Note (Signed)
This note was copied from a baby's chart. Lactation Consultation Note Went to mom's rm. To check on feeding. Baby fussy d/t heart screen. Assisted in latch after mom attempted over 5 min. Mom needed to lower the stimulation to latch. Shaking breast to much for baby to latch, elevating baby to high and nipple to low. Assisted in positioning, discussing proper latching and calming baby. Baby latched well.  LC feels mom needs to be seen again before d/c for weight check. Patient Name: Linda Chandler [id number] Date: 11/08/2016 Reason for consult: Follow-up assessment;Infant weight loss   Maternal Data Has patient been taught Hand Expression?: Yes Does the patient have breastfeeding experience prior to this delivery?: Yes  Feeding Feeding Type: Breast Fed Length of feed: 20 min (still bf)  LATCH Score/Interventions Latch: Repeated attempts needed to sustain latch, nipple held in mouth throughout feeding, stimulation needed to elicit sucking reflex. Intervention(s): Adjust position;Assist with latch;Breast massage;Breast compression  Audible Swallowing: A few with stimulation Intervention(s): Skin to skin;Hand expression Intervention(s): Alternate breast massage  Type of Nipple: Everted at rest and after stimulation  Comfort (Breast/Nipple): Filling, red/small blisters or bruises, mild/mod discomfort  Problem noted: Mild/Moderate discomfort Interventions (Mild/moderate discomfort): Hand massage;Hand expression  Hold (Positioning): Assistance needed to correctly position infant at breast and maintain latch. Intervention(s): Breastfeeding basics reviewed;Support Pillows;Position options;Skin to skin  LATCH Score: 6  Lactation Tools Discussed/Used Tools: Pump Breast pump type: Manual WIC Program: No Pump Review: Setup, frequency, and cleaning;Milk Storage Initiated by:: Peri Jefferson RN IBCLC Date initiated:: 11/08/16   Consult Status Consult Status: Follow-up (needs to be seen  ) Date: 11/08/16 Follow-up type: In-patient    [name] 11/08/2016, 5:42 AM

## 2016-11-08 NOTE — Lactation Note (Addendum)
This note was copied from a baby's chart. Lactation Consultation Note Mom states BF going well. BF her 1st child for 1 months. Mom plans to BF for 6 weeks exclusively then pump and bottle feed. Discussed supply and demand. Mom has DEBP at home.  Taught mom hand expression, had colostrum. Encouraged to hand express and give colostrum to baby.  Baby had 6% weight loss 23 hrs.of age.  Has documented 6 voids and 3 stools. Mom stated baby has had 5-6 stools. Stressed importance of I&O.  Assisted in football position. Mom denies nipple pain or painful latches. Baby sucking on pacifier when entered rm. Encouraged no pacifiers until after 2 weeks. Baby should be cluster feeding at this age, normal to induce lactation. Encouraged breast massage at intervals during BF. Discussed let down, engorgement, pumping, milk storage, and feeding every 2-3 hours after d/c home and on demand.  LC feels that mom needs to be seen again before d/c home for weight d/t weight loss. Gave mom bullet, taught hand expression, gave spoons for spoon feeding after BF. Gave mom hand pump for stimulation. Patient Name: Linda Chandler [id number] Date: 11/08/2016 Reason for consult: Follow-up assessment;Infant weight loss   Maternal Data Has patient been taught Hand Expression?: Yes Does the patient have breastfeeding experience prior to this delivery?: Yes  Feeding Feeding Type: Breast Fed Length of feed: 7 min  LATCH Score/Interventions Latch: Grasps breast easily, tongue down, lips flanged, rhythmical sucking.  Audible Swallowing: A few with stimulation  Type of Nipple: Everted at rest and after stimulation  Comfort (Breast/Nipple): Filling, red/small blisters or bruises, mild/mod discomfort  Problem noted: Mild/Moderate discomfort  Hold (Positioning): No assistance needed to correctly position infant at breast. Intervention(s): Breastfeeding basics reviewed;Support Pillows;Position options;Skin to skin  LATCH  Score: 9  Lactation Tools Discussed/Used WIC Program: No   Consult Status Consult Status: Complete Date: 11/08/16    [name] 11/08/2016, 5:14 AM

## 2017-10-28 ENCOUNTER — Encounter (HOSPITAL_COMMUNITY): Payer: Self-pay | Admitting: Family Medicine

## 2017-10-28 ENCOUNTER — Ambulatory Visit (HOSPITAL_COMMUNITY)
Admission: EM | Admit: 2017-10-28 | Discharge: 2017-10-28 | Disposition: A | Discharge status: Home / Self Care | Payer: Medicaid Other | Attending: Family Medicine | Admitting: Family Medicine

## 2017-10-28 ENCOUNTER — Other Ambulatory Visit: Payer: Self-pay

## 2017-10-28 DIAGNOSIS — B349 Viral infection, unspecified: Secondary | ICD-10-CM | POA: Diagnosis not present

## 2017-10-28 DIAGNOSIS — K529 Noninfective gastroenteritis and colitis, unspecified: Secondary | ICD-10-CM | POA: Diagnosis not present

## 2017-10-28 MED ORDER — ONDANSETRON 8 MG PO TBDP: 8.0000 mg | ORAL_TABLET | Freq: Three times a day (TID) | ORAL | 0 refills | Status: AC | PRN

## 2017-10-28 NOTE — ED Provider Notes (Signed)
[facility] CARE [facility]ENTER   [id number] 10/28/17 Arrival Time: 1720   SUBJECTIVE:  Linda Chandler is a 27 y.o. female who presents to the urgent care with complaint of malaise which began on Friday night and was followed by nausea, vomiting, diarrhea Saturday.  She continued to have these symptoms but to a lesser extent over the next 2 days associated with myalgia.  She initially had fever but it seemed to break last night.  Patient works at [employer].    Past Medical History:  Diagnosis Date  . Asthma    Family History  Problem Relation Age of Onset  . Hypertension Father    Social History   Socioeconomic History  . Marital status: Single    Spouse name: Not on file  . Number of children: Not on file  . Years of education: Not on file  . Highest education level: Not on file  Occupational History  . Not on file  Social Needs  . Financial resource strain: Not on file  . Food insecurity:    Worry: Not on file    Inability: Not on file  . Transportation needs:    Medical: Not on file    Non-medical: Not on file  Tobacco Use  . Smoking status: Former Smoker    Types: Cigarettes  . Smokeless tobacco: Never Used  Substance and Sexual Activity  . Alcohol use: No    Comment: 07/04/15  . Drug use: No  . Sexual activity: Yes    Birth control/protection: None  Lifestyle  . Physical activity:    Days per week: Not on file    Minutes per session: Not on file  . Stress: Not on file  Relationships  . Social connections:    Talks on phone: Not on file    Gets together: Not on file    Attends religious service: Not on file    Active member of club or organization: Not on file    Attends meetings of clubs or organizations: Not on file    Relationship status: Not on file  . Intimate partner violence:    Fear of current or ex partner: Not on file    Emotionally abused: Not on file    Physically abused: Not on file    Forced sexual activity: Not on file  Other Topics  Concern  . Not on file  Social History Narrative  . Not on file   No outpatient medications have been marked as taking for the 10/28/17 encounter [facility] Encounter).   Allergies  Allergen Reactions  . Cinnamon Anaphylaxis    swelling      ROS: As per HPI, remainder of ROS negative.   OBJECTIVE:   Vitals:   10/28/17 1851  BP: 120/78  Pulse: 76  Resp: 16  Temp: 98.1 F (36.7 C)  TempSrc: Oral  SpO2: 100%     General appearance: alert; no distress Eyes: PERRL; EOMI; conjunctiva normal HENT: normocephalic; atraumatic;  nasal mucosa normal; oral mucosa normal Neck: supple Lungs: clear to auscultation bilaterally Heart: regular rate and rhythm Abdomen: soft, non-tender; bowel sounds normal; no masses or organomegaly; no guarding or rebound tenderness Back: no CVA tenderness Extremities: no cyanosis or edema; symmetrical with no gross deformities Skin: warm and dry Neurologic: normal gait; grossly normal Psychological: alert and cooperative; normal mood and affect      Labs:  Results for orders placed or performed during the hospital encounter of 11/06/16  OB RESULT CONSOLE Group B Strep  Result Value Ref Range   GBS Negative   OB RESULTS CONSOLE GC/Chlamydia  Result Value Ref Range   Gonorrhea Negative    Chlamydia Negative   OB RESULTS CONSOLE RPR  Result Value Ref Range   RPR Nonreactive   OB RESULTS CONSOLE HIV antibody  Result Value Ref Range   HIV Non-reactive   OB RESULTS CONSOLE Rubella Antibody  Result Value Ref Range   Rubella Immune   OB RESULTS CONSOLE Hepatitis B surface antigen  Result Value Ref Range   Hepatitis B Surface Ag Negative   CBC  Result Value Ref Range   WBC 13.5 (H) 4.0 - 10.5 K/uL   RBC 3.87 3.87 - 5.11 MIL/uL   Hemoglobin 10.8 (L) 12.0 - 15.0 g/dL   HCT 02.533.6 (L) 42.736.0 - 06.246.0 %   MCV 86.8 78.0 - 100.0 fL   MCH 27.9 26.0 - 34.0 pg   MCHC 32.1 30.0 - 36.0 g/dL   RDW 37.619.7 (H) 28.311.5 - 15.115.5 %   Platelets 222 150 - 400 K/uL   RPR  Result Value Ref Range   RPR Ser Ql Non Reactive Non Reactive  CBC  Result Value Ref Range   WBC 17.0 (H) 4.0 - 10.5 K/uL   RBC 3.65 (L) 3.87 - 5.11 MIL/uL   Hemoglobin 10.2 (L) 12.0 - 15.0 g/dL   HCT 76.131.8 (L) 60.736.0 - 37.146.0 %   MCV 87.1 78.0 - 100.0 fL   MCH 27.9 26.0 - 34.0 pg   MCHC 32.1 30.0 - 36.0 g/dL   RDW 06.219.9 (H) 69.411.5 - 85.415.5 %   Platelets 200 150 - 400 K/uL  Comprehensive metabolic panel  Result Value Ref Range   Sodium 134 (L) 135 - 145 mmol/L   Potassium 3.4 (L) 3.5 - 5.1 mmol/L   Chloride 103 101 - 111 mmol/L   CO2 23 22 - 32 mmol/L   Glucose, Bld 143 (H) 65 - 99 mg/dL   BUN 6 6 - 20 mg/dL   Creatinine, Ser 6.270.49 0.44 - 1.00 mg/dL   Calcium 8.8 (L) 8.9 - 10.3 mg/dL   Total Protein 5.9 (L) 6.5 - 8.1 g/dL   Albumin 2.7 (L) 3.5 - 5.0 g/dL   AST 24 15 - 41 U/L   ALT 15 14 - 54 U/L   Alkaline Phosphatase 187 (H) 38 - 126 U/L   Total Bilirubin 0.5 0.3 - 1.2 mg/dL   GFR calc non Af Amer >60 >60 mL/min   GFR calc Af Amer >60 >60 mL/min   Anion gap 8 5 - 15  Uric acid  Result Value Ref Range   Uric Acid, Serum 3.0 2.3 - 6.6 mg/dL  OB RESULTS CONSOLE ABO/Rh  Result Value Ref Range   RH Type  Positive    ABO Grouping O   OB RESULTS CONSOLE Antibody Screen  Result Value Ref Range   Antibody Screen Negative   [facility] and screen [facility]  Result Value Ref Range   ABO/RH(D) O POS    Antibody Screen NEG    Sample Expiration 11/09/2016   ABO/Rh  Result Value Ref Range   ABO/RH(D) O POS     Labs Reviewed - No data to display  No results found.     ASSESSMENT & PLAN:  1. Gastroenteritis   2. Viral syndrome     Meds ordered this encounter  Medications  . ondansetron (ZOFRAN-ODT) 8 MG disintegrating tablet    Sig: Take 1 tablet (8 mg total)  by mouth every 8 (eight) hours as needed for nausea.    Dispense:  12 tablet    Refill:  0    Reviewed expectations re: course of current medical issues. Questions answered. Outlined signs and  symptoms indicating need for more acute intervention. Patient verbalized understanding. After Visit Summary given.      Elvina Sidle, MD 10/28/17 [phone]

## 2017-10-28 NOTE — ED Triage Notes (Signed)
Pt presents with complaints of nausea, vomiting and diarrhea x 4 days

## 2020-03-02 LAB — HM PAP SMEAR: HM Pap smear: NEGATIVE

## 2020-03-04 LAB — HM PAP SMEAR: HM Pap smear: NEGATIVE

## 2020-03-04 LAB — RESULTS CONSOLE HPV: CHL HPV: NEGATIVE

## 2020-06-18 ENCOUNTER — Other Ambulatory Visit: Payer: Self-pay

## 2020-06-18 ENCOUNTER — Encounter (HOSPITAL_COMMUNITY): Payer: Self-pay

## 2020-06-18 ENCOUNTER — Ambulatory Visit (HOSPITAL_COMMUNITY)
Admission: EM | Admit: 2020-06-18 | Discharge: 2020-06-18 | Disposition: A | Discharge status: Home / Self Care | Payer: 59 | Attending: Urgent Care | Admitting: Urgent Care

## 2020-06-18 DIAGNOSIS — R300 Dysuria: Secondary | ICD-10-CM | POA: Diagnosis not present

## 2020-06-18 DIAGNOSIS — R350 Frequency of micturition: Secondary | ICD-10-CM

## 2020-06-18 DIAGNOSIS — N3001 Acute cystitis with hematuria: Secondary | ICD-10-CM | POA: Diagnosis not present

## 2020-06-18 DIAGNOSIS — Z3202 Encounter for pregnancy test, result negative: Secondary | ICD-10-CM | POA: Diagnosis not present

## 2020-06-18 LAB — POCT URINALYSIS DIPSTICK, ED / UC
Bilirubin Urine: NEGATIVE
Glucose, UA: NEGATIVE mg/dL
Ketones, ur: NEGATIVE mg/dL
Nitrite: POSITIVE — AB
Protein, ur: 30 mg/dL — AB
Specific Gravity, Urine: 1.02 (ref 1.005–1.030)
Urobilinogen, UA: 0.2 mg/dL (ref 0.0–1.0)
pH: 6 (ref 5.0–8.0)

## 2020-06-18 LAB — POC URINE PREG, ED: Preg Test, Ur: NEGATIVE

## 2020-06-18 MED ORDER — CEPHALEXIN 500 MG PO CAPS: 500.0000 mg | ORAL_CAPSULE | Freq: Two times a day (BID) | ORAL | 0 refills | Status: AC

## 2020-06-18 NOTE — Discharge Instructions (Signed)
Make sure you hydrate very well with plain water and a quantity of 64 ounces of water a day.  Please limit drinks that are considered urinary irritants such as soda, sweet tea, coffee, energy drinks, alcohol.  These can worsen your UTI symptoms and also be the source of them.  I will let you know about your urine culture results through MyChart to see if we need to change your antibiotics based off of those results. ° °

## 2020-06-18 NOTE — ED Triage Notes (Signed)
Pt c/o burning with urination, hematuria onset yesterday afternoon along with urinary urgency, frequency. Also reports lower abdominal discomfort onset Thursday.  Denies fever, flank/back pain, n/v/d.  Took AZO for symptoms.

## 2020-06-18 NOTE — ED Provider Notes (Addendum)
[facility]   MRN: [id number] DOB: [birth year]  Subjective:   Linda Chandler is a 29 y.o. female presenting for 1 day history of acute onset dysuria, urinary frequency, urgency, now having lower abdominal pain today.  Patient admits that she does not drink water very well.  She does have to drink a lot of coffee.  Denies fever, nausea, vomiting, vaginal discharge, flank pain.  Patient has an IUD.  Has little concern for STI.  No current facility-administered medications for this encounter.  Current Outpatient Medications:  .  calcium carbonate (TUMS - DOSED IN MG ELEMENTAL CALCIUM) 500 MG chewable tablet, Chew 1 tablet by mouth 2 (two) times daily as needed for indigestion or heartburn., Disp: , Rfl:  .  ferrous sulfate (FERROUSUL) 325 (65 FE) MG tablet, Take 1 tablet (325 mg total) by mouth 2 (two) times daily., Disp: 60 tablet, Rfl: 1 .  ibuprofen (ADVIL,MOTRIN) 600 MG tablet, Take 1 tablet (600 mg total) by mouth every 6 (six) hours., Disp: 30 tablet, Rfl: 0 .  ondansetron (ZOFRAN-ODT) 8 MG disintegrating tablet, Take 1 tablet (8 mg total) by mouth every 8 (eight) hours as needed for nausea., Disp: 12 tablet, Rfl: 0 .  Prenatal Vit-Fe Fumarate-FA (PRENATAL MULTIVITAMIN) TABS tablet, Take 1 tablet by mouth daily at 12 noon., Disp: , Rfl:     Allergies  Allergen Reactions  . Cinnamon Anaphylaxis    swelling    Past Medical History:  Diagnosis Date  . Asthma      Past Surgical History:  Procedure Laterality Date  . DIAGNOSTIC LAPAROSCOPY WITH REMOVAL OF ECTOPIC PREGNANCY Left 10/24/2015   Procedure: DIAGNOSTIC LAPAROSCOPY WITH REMOVAL OF LEFT ECTOPIC PREGNANCY;  Surgeon: Hildred Laser, MD;  Location: [facility] ORS;  Service: Gynecology;  Laterality: Left;    Family History  Problem Relation Age of Onset  . Hypertension Father     Social History   Tobacco Use  . Smoking status: Former Smoker    Types: Cigarettes  . Smokeless tobacco: Never Used  Substance  Use Topics  . Alcohol use: No    Comment: 07/04/15  . Drug use: No    ROS   Objective:   Vitals: BP 115/78 (BP Location: Left Arm)   Pulse (!) 56   Temp 98.1 F (36.7 C) (Oral)   Resp 16   SpO2 100%   Physical Exam Constitutional:      General: She is not in acute distress.    Appearance: Normal appearance. She is well-developed. She is not ill-appearing, toxic-appearing or diaphoretic.  HENT:     Head: Normocephalic and atraumatic.     Nose: Nose normal.     Mouth/Throat:     Mouth: Mucous membranes are moist.     Pharynx: Oropharynx is clear.  Eyes:     General: No scleral icterus.       Right eye: No discharge.        Left eye: No discharge.     Extraocular Movements: Extraocular movements intact.     Conjunctiva/sclera: Conjunctivae normal.     Pupils: Pupils are equal, round, and reactive to light.  Cardiovascular:     Rate and Rhythm: Normal rate.  Pulmonary:     Effort: Pulmonary effort is normal.  Abdominal:     General: Bowel sounds are normal. There is no distension.     Palpations: Abdomen is soft. There is no mass.     Tenderness: There is abdominal tenderness in the suprapubic  area. There is no right CVA tenderness, left CVA tenderness, guarding or rebound.  Skin:    General: Skin is warm and dry.  Neurological:     General: No focal deficit present.     Mental Status: She is alert and oriented to person, place, and time.  Psychiatric:        Mood and Affect: Mood normal.        Behavior: Behavior normal.        Thought Content: Thought content normal.        Judgment: Judgment normal.     Results for orders placed or performed during the hospital encounter of 06/18/20 (from the past 24 hour(s))  POC Urinalysis dipstick     Status: Abnormal   Collection Time: 06/18/20 11:48 AM  Result Value Ref Range   Glucose, UA NEGATIVE NEGATIVE mg/dL   Bilirubin Urine NEGATIVE NEGATIVE   Ketones, ur NEGATIVE NEGATIVE mg/dL   Specific Gravity, Urine 1.020  1.005 - 1.030   Hgb urine dipstick LARGE (A) NEGATIVE   pH 6.0 5.0 - 8.0   Protein, ur 30 (A) NEGATIVE mg/dL   Urobilinogen, UA 0.2 0.0 - 1.0 mg/dL   Nitrite POSITIVE (A) NEGATIVE   Leukocytes,Ua SMALL (A) NEGATIVE  POC urine pregnancy     Status: None   Collection Time: 06/18/20 11:59 AM  Result Value Ref Range   Preg Test, Ur NEGATIVE NEGATIVE    Assessment and Plan :   PDMP not reviewed this encounter.  1. Acute cystitis with hematuria   2. Dysuria   3. Urinary frequency     Suspect suprapubic tenderness related to her acute cystitis.  Start Keflex to cover for acute cystitis, urine culture pending.  Recommended aggressive hydration, limiting urinary irritants. Counseled patient on potential for adverse effects with medications prescribed/recommended today, ER and return-to-clinic precautions discussed, patient verbalized understanding.   Wallis Bamberg, PA-C 06/18/20 1210

## 2020-06-19 LAB — URINE CULTURE: Culture: 80000 — AB

## 2020-06-20 LAB — URINE CULTURE

## 2020-09-01 DIAGNOSIS — Z20822 Contact with and (suspected) exposure to covid-19: Secondary | ICD-10-CM | POA: Diagnosis not present

## 2020-11-29 ENCOUNTER — Ambulatory Visit: Payer: BLUE CROSS/BLUE SHIELD | Admitting: Family Medicine

## 2020-11-29 ENCOUNTER — Encounter: Payer: Self-pay | Admitting: Family Medicine

## 2020-11-29 VITALS — BP 108/72 | HR 52 | Temp 98.5°F | Ht 67.5 in | Wt 128.8 lb

## 2020-11-29 DIAGNOSIS — I7300 Raynaud's syndrome without gangrene: Secondary | ICD-10-CM | POA: Diagnosis not present

## 2020-11-29 DIAGNOSIS — J4530 Mild persistent asthma, uncomplicated: Secondary | ICD-10-CM

## 2020-11-29 DIAGNOSIS — Z0000 Encounter for general adult medical examination without abnormal findings: Secondary | ICD-10-CM

## 2020-11-29 DIAGNOSIS — Z13220 Encounter for screening for lipoid disorders: Secondary | ICD-10-CM | POA: Diagnosis not present

## 2020-11-29 DIAGNOSIS — J309 Allergic rhinitis, unspecified: Secondary | ICD-10-CM

## 2020-11-29 LAB — CBC WITH DIFFERENTIAL/PLATELET
Basos: 1 %
EOS (ABSOLUTE): 0 10*3/uL (ref 0.0–0.4)
MCH: 31.1 pg (ref 26.6–33.0)
MCV: 92 fL (ref 79–97)
Platelets: 249 10*3/uL (ref 150–450)

## 2020-11-29 LAB — COMPREHENSIVE METABOLIC PANEL

## 2020-11-29 MED ORDER — ALBUTEROL SULFATE HFA 108 (90 BASE) MCG/ACT IN AERS: 2.0000 | INHALATION_SPRAY | Freq: Four times a day (QID) | RESPIRATORY_TRACT | 0 refills | Status: AC | PRN

## 2020-11-29 MED ORDER — ASMANEX (30 METERED DOSES) 110 MCG/INH IN AEPB: 1.0000 "application " | INHALATION_SPRAY | Freq: Every day | RESPIRATORY_TRACT | 11 refills | Status: AC

## 2020-11-29 NOTE — Progress Notes (Signed)
   Subjective:    Patient ID: Linda Chandler, female    DOB: 19-May-1991, 30 y.o.   MRN: [id number]  HPI She is here for complete examination.  She does state that she has noted since November of, when she gets cold hands, she will note the tips of several fingers getting quite white with decreased sensation it takes an hour or so to go away.  She is on no medications other than multivitamins as well as taking Claritin-D.  She does not smoke.  No fever, chills, weight change.  She does complain of some difficulty with intermittent constipation and some lower abdominal cramping type pain.  She does have underlying allergies and uses Claritin-D.  She also has a history of asthma and has been using her daughter's albuterol inhaler.  She notes symptoms pretty much on a daily basis but is not using the inhaler a couple times per week.  She has 2 daughters.  She is not married but the children's father does help with finances.  Family and social history as well as health maintenance and immunizations was reviewed.  No immunizations are noted.  She does see her gynecologist.   Review of Systems     Objective:   Physical Exam Alert and in no distress. Tympanic membranes and canals are normal. Pharyngeal area is normal. Neck is supple without adenopathy or thyromegaly. Cardiac exam shows a regular sinus rhythm without murmurs or gallops. Lungs are clear to auscultation.  Abdominal exam shows no masses or tenderness with normal bowel sounds       Assessment & Plan:  Routine general medical examination at a health care facility - Plan: CBC with Differential/Platelet, Comprehensive metabolic panel, Lipid panel  Raynaud's disease without gangrene  Allergic rhinitis, unspecified seasonality, unspecified trigger  Mild persistent asthma without complication - Plan: albuterol (VENTOLIN HFA) 108 (90 Base) MCG/ACT inhaler, Mometasone Furoate (ASMANEX, 30 METERED DOSES,) 110 MCG/INH AEPB I explained that I  thought she had Raynaud's disease and recommended keeping her hands as warm as possible.  Also recommend stopping the Claritin-D.  She will keep me informed concerning this. I think she would need to be placed on a steroid inhaler and we will put her on Asmanex and albuterol.  Cautioned to let me know with her insurance covers this medication and if not we will switch her to 1 more cost effective.  Otherwise I will see her again in 1 month for follow-up.  We will also get records from her OB/GYN concerning her immunizations.

## 2020-11-30 LAB — CBC WITH DIFFERENTIAL/PLATELET
Basophils Absolute: 0 10*3/uL (ref 0.0–0.2)
Eos: 0 %
Hematocrit: 39.7 % (ref 34.0–46.6)
Hemoglobin: 13.4 g/dL (ref 11.1–15.9)
Immature Grans (Abs): 0 10*3/uL (ref 0.0–0.1)
Immature Granulocytes: 0 %
Lymphocytes Absolute: 1.7 10*3/uL (ref 0.7–3.1)
Lymphs: 26 %
MCHC: 33.8 g/dL (ref 31.5–35.7)
Monocytes Absolute: 0.6 10*3/uL (ref 0.1–0.9)
Monocytes: 8 %
Neutrophils Absolute: 4.3 10*3/uL (ref 1.4–7.0)
Neutrophils: 65 %
RBC: 4.31 x10E6/uL (ref 3.77–5.28)
RDW: 12.4 % (ref 11.7–15.4)
WBC: 6.7 10*3/uL (ref 3.4–10.8)

## 2020-11-30 LAB — LIPID PANEL
Chol/HDL Ratio: 2.1 ratio (ref 0.0–4.4)
Cholesterol, Total: 180 mg/dL (ref 100–199)
HDL: 86 mg/dL (ref >39)
LDL Chol Calc (NIH): 82 mg/dL (ref 0–99)
Triglycerides: 66 mg/dL (ref 0–149)
VLDL Cholesterol Cal: 12 mg/dL (ref 5–40)

## 2020-11-30 LAB — COMPREHENSIVE METABOLIC PANEL
Albumin/Globulin Ratio: 2 (ref 1.2–2.2)
Albumin: 4.7 g/dL (ref 3.9–5.0)
Alkaline Phosphatase: 65 IU/L (ref 44–121)
BUN/Creatinine Ratio: 12 (ref 9–23)
BUN: 10 mg/dL (ref 6–20)
Bilirubin Total: 0.8 mg/dL (ref 0.0–1.2)
Calcium: 9.9 mg/dL (ref 8.7–10.2)
Chloride: 103 mmol/L (ref 96–106)
Creatinine, Ser: 0.81 mg/dL (ref 0.57–1.00)
Globulin, Total: 2.4 g/dL (ref 1.5–4.5)
Glucose: 87 mg/dL (ref 65–99)
Potassium: 4.4 mmol/L (ref 3.5–5.2)
Sodium: 139 mmol/L (ref 134–144)
Total Protein: 7.1 g/dL (ref 6.0–8.5)
eGFR: 100 mL/min/{1.73_m2} (ref >59)

## 2020-12-23 ENCOUNTER — Encounter: Payer: Self-pay | Admitting: Family Medicine

## 2021-01-03 ENCOUNTER — Ambulatory Visit: Payer: BLUE CROSS/BLUE SHIELD | Admitting: Family Medicine

## 2021-01-18 ENCOUNTER — Telehealth: Payer: Self-pay | Admitting: Family Medicine

## 2021-01-18 NOTE — Telephone Encounter (Signed)
Records received from [facility] °

## 2021-02-24 DIAGNOSIS — R350 Frequency of micturition: Secondary | ICD-10-CM | POA: Diagnosis not present

## 2021-02-28 DIAGNOSIS — F4310 Post-traumatic stress disorder, unspecified: Secondary | ICD-10-CM | POA: Diagnosis not present

## 2021-02-28 DIAGNOSIS — F1010 Alcohol abuse, uncomplicated: Secondary | ICD-10-CM | POA: Diagnosis not present

## 2021-03-02 DIAGNOSIS — F1010 Alcohol abuse, uncomplicated: Secondary | ICD-10-CM | POA: Diagnosis not present

## 2021-03-02 DIAGNOSIS — F4310 Post-traumatic stress disorder, unspecified: Secondary | ICD-10-CM | POA: Diagnosis not present

## 2021-03-07 DIAGNOSIS — F4310 Post-traumatic stress disorder, unspecified: Secondary | ICD-10-CM | POA: Diagnosis not present

## 2021-03-07 DIAGNOSIS — F1010 Alcohol abuse, uncomplicated: Secondary | ICD-10-CM | POA: Diagnosis not present

## 2021-03-09 DIAGNOSIS — F4310 Post-traumatic stress disorder, unspecified: Secondary | ICD-10-CM | POA: Diagnosis not present

## 2021-03-09 DIAGNOSIS — F1010 Alcohol abuse, uncomplicated: Secondary | ICD-10-CM | POA: Diagnosis not present

## 2021-03-13 DIAGNOSIS — F4310 Post-traumatic stress disorder, unspecified: Secondary | ICD-10-CM | POA: Diagnosis not present

## 2021-03-13 DIAGNOSIS — F1010 Alcohol abuse, uncomplicated: Secondary | ICD-10-CM | POA: Diagnosis not present

## 2021-03-16 DIAGNOSIS — F4310 Post-traumatic stress disorder, unspecified: Secondary | ICD-10-CM | POA: Diagnosis not present

## 2021-03-16 DIAGNOSIS — F1010 Alcohol abuse, uncomplicated: Secondary | ICD-10-CM | POA: Diagnosis not present

## 2021-03-23 DIAGNOSIS — F1010 Alcohol abuse, uncomplicated: Secondary | ICD-10-CM | POA: Diagnosis not present

## 2021-03-23 DIAGNOSIS — F4310 Post-traumatic stress disorder, unspecified: Secondary | ICD-10-CM | POA: Diagnosis not present

## 2021-03-27 DIAGNOSIS — F4310 Post-traumatic stress disorder, unspecified: Secondary | ICD-10-CM | POA: Diagnosis not present

## 2021-03-27 DIAGNOSIS — F1010 Alcohol abuse, uncomplicated: Secondary | ICD-10-CM | POA: Diagnosis not present

## 2021-03-30 DIAGNOSIS — F1010 Alcohol abuse, uncomplicated: Secondary | ICD-10-CM | POA: Diagnosis not present

## 2021-03-30 DIAGNOSIS — F4310 Post-traumatic stress disorder, unspecified: Secondary | ICD-10-CM | POA: Diagnosis not present

## 2021-04-05 DIAGNOSIS — F4310 Post-traumatic stress disorder, unspecified: Secondary | ICD-10-CM | POA: Diagnosis not present

## 2021-04-05 DIAGNOSIS — F1010 Alcohol abuse, uncomplicated: Secondary | ICD-10-CM | POA: Diagnosis not present

## 2021-04-11 DIAGNOSIS — F4310 Post-traumatic stress disorder, unspecified: Secondary | ICD-10-CM | POA: Diagnosis not present

## 2021-04-11 DIAGNOSIS — F1010 Alcohol abuse, uncomplicated: Secondary | ICD-10-CM | POA: Diagnosis not present

## 2021-04-18 DIAGNOSIS — F1010 Alcohol abuse, uncomplicated: Secondary | ICD-10-CM | POA: Diagnosis not present

## 2021-04-18 DIAGNOSIS — F4310 Post-traumatic stress disorder, unspecified: Secondary | ICD-10-CM | POA: Diagnosis not present

## 2021-04-26 DIAGNOSIS — F4310 Post-traumatic stress disorder, unspecified: Secondary | ICD-10-CM | POA: Diagnosis not present

## 2021-04-26 DIAGNOSIS — F1010 Alcohol abuse, uncomplicated: Secondary | ICD-10-CM | POA: Diagnosis not present

## 2021-05-03 DIAGNOSIS — F4310 Post-traumatic stress disorder, unspecified: Secondary | ICD-10-CM | POA: Diagnosis not present

## 2021-05-03 DIAGNOSIS — F1010 Alcohol abuse, uncomplicated: Secondary | ICD-10-CM | POA: Diagnosis not present

## 2021-05-10 DIAGNOSIS — F4310 Post-traumatic stress disorder, unspecified: Secondary | ICD-10-CM | POA: Diagnosis not present

## 2021-05-10 DIAGNOSIS — F1010 Alcohol abuse, uncomplicated: Secondary | ICD-10-CM | POA: Diagnosis not present

## 2021-05-17 DIAGNOSIS — F1010 Alcohol abuse, uncomplicated: Secondary | ICD-10-CM | POA: Diagnosis not present

## 2021-05-17 DIAGNOSIS — F4310 Post-traumatic stress disorder, unspecified: Secondary | ICD-10-CM | POA: Diagnosis not present

## 2021-05-24 DIAGNOSIS — F4310 Post-traumatic stress disorder, unspecified: Secondary | ICD-10-CM | POA: Diagnosis not present

## 2021-05-24 DIAGNOSIS — F1010 Alcohol abuse, uncomplicated: Secondary | ICD-10-CM | POA: Diagnosis not present

## 2021-05-31 DIAGNOSIS — F1010 Alcohol abuse, uncomplicated: Secondary | ICD-10-CM | POA: Diagnosis not present

## 2021-05-31 DIAGNOSIS — F4310 Post-traumatic stress disorder, unspecified: Secondary | ICD-10-CM | POA: Diagnosis not present

## 2021-06-07 DIAGNOSIS — F4310 Post-traumatic stress disorder, unspecified: Secondary | ICD-10-CM | POA: Diagnosis not present

## 2021-06-07 DIAGNOSIS — F1010 Alcohol abuse, uncomplicated: Secondary | ICD-10-CM | POA: Diagnosis not present

## 2021-06-14 DIAGNOSIS — F4310 Post-traumatic stress disorder, unspecified: Secondary | ICD-10-CM | POA: Diagnosis not present

## 2021-06-14 DIAGNOSIS — F1010 Alcohol abuse, uncomplicated: Secondary | ICD-10-CM | POA: Diagnosis not present

## 2021-06-20 DIAGNOSIS — F4310 Post-traumatic stress disorder, unspecified: Secondary | ICD-10-CM | POA: Diagnosis not present

## 2021-06-20 DIAGNOSIS — F1010 Alcohol abuse, uncomplicated: Secondary | ICD-10-CM | POA: Diagnosis not present

## 2021-06-28 DIAGNOSIS — F4310 Post-traumatic stress disorder, unspecified: Secondary | ICD-10-CM | POA: Diagnosis not present

## 2021-06-28 DIAGNOSIS — F1010 Alcohol abuse, uncomplicated: Secondary | ICD-10-CM | POA: Diagnosis not present

## 2021-07-05 DIAGNOSIS — F1010 Alcohol abuse, uncomplicated: Secondary | ICD-10-CM | POA: Diagnosis not present

## 2021-07-05 DIAGNOSIS — F4310 Post-traumatic stress disorder, unspecified: Secondary | ICD-10-CM | POA: Diagnosis not present

## 2021-07-12 DIAGNOSIS — F1010 Alcohol abuse, uncomplicated: Secondary | ICD-10-CM | POA: Diagnosis not present

## 2021-07-12 DIAGNOSIS — F4310 Post-traumatic stress disorder, unspecified: Secondary | ICD-10-CM | POA: Diagnosis not present

## 2021-07-18 DIAGNOSIS — F1010 Alcohol abuse, uncomplicated: Secondary | ICD-10-CM | POA: Diagnosis not present

## 2021-07-18 DIAGNOSIS — F4310 Post-traumatic stress disorder, unspecified: Secondary | ICD-10-CM | POA: Diagnosis not present

## 2021-07-26 DIAGNOSIS — F1010 Alcohol abuse, uncomplicated: Secondary | ICD-10-CM | POA: Diagnosis not present

## 2021-07-26 DIAGNOSIS — F4310 Post-traumatic stress disorder, unspecified: Secondary | ICD-10-CM | POA: Diagnosis not present

## 2021-08-02 DIAGNOSIS — F1010 Alcohol abuse, uncomplicated: Secondary | ICD-10-CM | POA: Diagnosis not present

## 2021-08-02 DIAGNOSIS — F4310 Post-traumatic stress disorder, unspecified: Secondary | ICD-10-CM | POA: Diagnosis not present

## 2021-08-23 DIAGNOSIS — F4310 Post-traumatic stress disorder, unspecified: Secondary | ICD-10-CM | POA: Diagnosis not present

## 2021-08-23 DIAGNOSIS — F1010 Alcohol abuse, uncomplicated: Secondary | ICD-10-CM | POA: Diagnosis not present

## 2021-08-29 DIAGNOSIS — F4310 Post-traumatic stress disorder, unspecified: Secondary | ICD-10-CM | POA: Diagnosis not present

## 2021-08-29 DIAGNOSIS — F1010 Alcohol abuse, uncomplicated: Secondary | ICD-10-CM | POA: Diagnosis not present

## 2021-09-12 DIAGNOSIS — F1010 Alcohol abuse, uncomplicated: Secondary | ICD-10-CM | POA: Diagnosis not present

## 2021-09-12 DIAGNOSIS — F4310 Post-traumatic stress disorder, unspecified: Secondary | ICD-10-CM | POA: Diagnosis not present

## 2021-09-26 DIAGNOSIS — F4310 Post-traumatic stress disorder, unspecified: Secondary | ICD-10-CM | POA: Diagnosis not present

## 2021-09-26 DIAGNOSIS — F1010 Alcohol abuse, uncomplicated: Secondary | ICD-10-CM | POA: Diagnosis not present

## 2021-10-17 DIAGNOSIS — F1010 Alcohol abuse, uncomplicated: Secondary | ICD-10-CM | POA: Diagnosis not present

## 2021-10-17 DIAGNOSIS — F4310 Post-traumatic stress disorder, unspecified: Secondary | ICD-10-CM | POA: Diagnosis not present

## 2021-10-31 DIAGNOSIS — F1010 Alcohol abuse, uncomplicated: Secondary | ICD-10-CM | POA: Diagnosis not present

## 2021-10-31 DIAGNOSIS — F4310 Post-traumatic stress disorder, unspecified: Secondary | ICD-10-CM | POA: Diagnosis not present

## 2021-11-01 DIAGNOSIS — Z113 Encounter for screening for infections with a predominantly sexual mode of transmission: Secondary | ICD-10-CM | POA: Diagnosis not present

## 2021-11-01 DIAGNOSIS — Z01419 Encounter for gynecological examination (general) (routine) without abnormal findings: Secondary | ICD-10-CM | POA: Diagnosis not present

## 2021-11-01 DIAGNOSIS — Z1159 Encounter for screening for other viral diseases: Secondary | ICD-10-CM | POA: Diagnosis not present

## 2021-11-01 DIAGNOSIS — Z118 Encounter for screening for other infectious and parasitic diseases: Secondary | ICD-10-CM | POA: Diagnosis not present

## 2021-11-01 DIAGNOSIS — Z681 Body mass index (BMI) 19 or less, adult: Secondary | ICD-10-CM | POA: Diagnosis not present

## 2021-11-01 DIAGNOSIS — Z114 Encounter for screening for human immunodeficiency virus [HIV]: Secondary | ICD-10-CM | POA: Diagnosis not present

## 2021-11-23 DIAGNOSIS — F4310 Post-traumatic stress disorder, unspecified: Secondary | ICD-10-CM | POA: Diagnosis not present

## 2021-11-23 DIAGNOSIS — F1010 Alcohol abuse, uncomplicated: Secondary | ICD-10-CM | POA: Diagnosis not present

## 2021-12-05 DIAGNOSIS — F1010 Alcohol abuse, uncomplicated: Secondary | ICD-10-CM | POA: Diagnosis not present

## 2021-12-05 DIAGNOSIS — F4310 Post-traumatic stress disorder, unspecified: Secondary | ICD-10-CM | POA: Diagnosis not present

## 2021-12-21 DIAGNOSIS — F4310 Post-traumatic stress disorder, unspecified: Secondary | ICD-10-CM | POA: Diagnosis not present

## 2021-12-21 DIAGNOSIS — F1010 Alcohol abuse, uncomplicated: Secondary | ICD-10-CM | POA: Diagnosis not present

## 2022-01-04 DIAGNOSIS — F4310 Post-traumatic stress disorder, unspecified: Secondary | ICD-10-CM | POA: Diagnosis not present

## 2022-01-04 DIAGNOSIS — F1010 Alcohol abuse, uncomplicated: Secondary | ICD-10-CM | POA: Diagnosis not present

## 2022-01-17 DIAGNOSIS — R001 Bradycardia, unspecified: Secondary | ICD-10-CM | POA: Diagnosis not present

## 2022-01-17 DIAGNOSIS — F39 Unspecified mood [affective] disorder: Secondary | ICD-10-CM | POA: Diagnosis not present

## 2022-01-17 DIAGNOSIS — R636 Underweight: Secondary | ICD-10-CM | POA: Diagnosis not present

## 2022-01-17 DIAGNOSIS — R6889 Other general symptoms and signs: Secondary | ICD-10-CM | POA: Diagnosis not present

## 2022-02-01 DIAGNOSIS — F4310 Post-traumatic stress disorder, unspecified: Secondary | ICD-10-CM | POA: Diagnosis not present

## 2022-02-01 DIAGNOSIS — F1010 Alcohol abuse, uncomplicated: Secondary | ICD-10-CM | POA: Diagnosis not present

## 2022-02-08 DIAGNOSIS — F1010 Alcohol abuse, uncomplicated: Secondary | ICD-10-CM | POA: Diagnosis not present

## 2022-02-08 DIAGNOSIS — F4310 Post-traumatic stress disorder, unspecified: Secondary | ICD-10-CM | POA: Diagnosis not present

## 2022-04-04 ENCOUNTER — Encounter: Payer: Self-pay | Admitting: Internal Medicine

## 2022-05-08 ENCOUNTER — Encounter: Payer: Self-pay | Admitting: Internal Medicine

## 2022-05-21 ENCOUNTER — Encounter: Payer: Self-pay | Admitting: Internal Medicine

## 2022-08-10 DIAGNOSIS — J029 Acute pharyngitis, unspecified: Secondary | ICD-10-CM | POA: Diagnosis not present

## 2022-08-10 DIAGNOSIS — R0981 Nasal congestion: Secondary | ICD-10-CM | POA: Diagnosis not present

## 2022-08-10 DIAGNOSIS — R519 Headache, unspecified: Secondary | ICD-10-CM | POA: Diagnosis not present

## 2022-08-10 DIAGNOSIS — R5383 Other fatigue: Secondary | ICD-10-CM | POA: Diagnosis not present

## 2022-11-06 DIAGNOSIS — R87810 Cervical high risk human papillomavirus (HPV) DNA test positive: Secondary | ICD-10-CM | POA: Diagnosis not present

## 2022-11-06 DIAGNOSIS — Z124 Encounter for screening for malignant neoplasm of cervix: Secondary | ICD-10-CM | POA: Diagnosis not present

## 2022-11-06 DIAGNOSIS — Z01419 Encounter for gynecological examination (general) (routine) without abnormal findings: Secondary | ICD-10-CM | POA: Diagnosis not present

## 2022-11-12 DIAGNOSIS — L700 Acne vulgaris: Secondary | ICD-10-CM | POA: Diagnosis not present

## 2022-11-12 DIAGNOSIS — L708 Other acne: Secondary | ICD-10-CM | POA: Diagnosis not present

## 2023-01-22 DIAGNOSIS — Z0000 Encounter for general adult medical examination without abnormal findings: Secondary | ICD-10-CM | POA: Diagnosis not present

## 2023-01-22 DIAGNOSIS — Z79899 Other long term (current) drug therapy: Secondary | ICD-10-CM | POA: Diagnosis not present

## 2023-02-12 DIAGNOSIS — L708 Other acne: Secondary | ICD-10-CM | POA: Diagnosis not present

## 2023-04-29 DIAGNOSIS — B3731 Acute candidiasis of vulva and vagina: Secondary | ICD-10-CM | POA: Diagnosis not present

## 2023-05-15 DIAGNOSIS — L708 Other acne: Secondary | ICD-10-CM | POA: Diagnosis not present

## 2023-08-15 DIAGNOSIS — L700 Acne vulgaris: Secondary | ICD-10-CM | POA: Diagnosis not present

## 2023-08-15 DIAGNOSIS — L708 Other acne: Secondary | ICD-10-CM | POA: Diagnosis not present

## 2023-09-24 ENCOUNTER — Encounter: Payer: Self-pay | Admitting: Internal Medicine

## 2023-11-07 DIAGNOSIS — F418 Other specified anxiety disorders: Secondary | ICD-10-CM | POA: Diagnosis not present

## 2023-11-07 DIAGNOSIS — R87810 Cervical high risk human papillomavirus (HPV) DNA test positive: Secondary | ICD-10-CM | POA: Diagnosis not present

## 2023-11-07 DIAGNOSIS — Z01411 Encounter for gynecological examination (general) (routine) with abnormal findings: Secondary | ICD-10-CM | POA: Diagnosis not present

## 2023-11-07 DIAGNOSIS — Z1331 Encounter for screening for depression: Secondary | ICD-10-CM | POA: Diagnosis not present

## 2023-11-07 DIAGNOSIS — Z01419 Encounter for gynecological examination (general) (routine) without abnormal findings: Secondary | ICD-10-CM | POA: Diagnosis not present

## 2023-11-28 DIAGNOSIS — Z3202 Encounter for pregnancy test, result negative: Secondary | ICD-10-CM | POA: Diagnosis not present

## 2023-11-28 DIAGNOSIS — R87810 Cervical high risk human papillomavirus (HPV) DNA test positive: Secondary | ICD-10-CM | POA: Diagnosis not present

## 2023-11-28 DIAGNOSIS — N871 Moderate cervical dysplasia: Secondary | ICD-10-CM | POA: Diagnosis not present

## 2023-12-10 DIAGNOSIS — R1030 Lower abdominal pain, unspecified: Secondary | ICD-10-CM | POA: Diagnosis not present

## 2023-12-10 DIAGNOSIS — N760 Acute vaginitis: Secondary | ICD-10-CM | POA: Diagnosis not present

## 2023-12-13 DIAGNOSIS — N871 Moderate cervical dysplasia: Secondary | ICD-10-CM | POA: Diagnosis not present

## 2023-12-23 DIAGNOSIS — L239 Allergic contact dermatitis, unspecified cause: Secondary | ICD-10-CM | POA: Diagnosis not present

## 2023-12-23 DIAGNOSIS — J309 Allergic rhinitis, unspecified: Secondary | ICD-10-CM | POA: Diagnosis not present

## 2023-12-24 DIAGNOSIS — Z975 Presence of (intrauterine) contraceptive device: Secondary | ICD-10-CM | POA: Diagnosis not present

## 2023-12-24 DIAGNOSIS — N921 Excessive and frequent menstruation with irregular cycle: Secondary | ICD-10-CM | POA: Diagnosis not present

## 2023-12-24 DIAGNOSIS — R1030 Lower abdominal pain, unspecified: Secondary | ICD-10-CM | POA: Diagnosis not present

## 2024-02-12 DIAGNOSIS — Z23 Encounter for immunization: Secondary | ICD-10-CM | POA: Diagnosis not present

## 2024-02-12 DIAGNOSIS — Z13220 Encounter for screening for lipoid disorders: Secondary | ICD-10-CM | POA: Diagnosis not present

## 2024-02-12 DIAGNOSIS — Z0000 Encounter for general adult medical examination without abnormal findings: Secondary | ICD-10-CM | POA: Diagnosis not present

## 2024-02-12 DIAGNOSIS — Z79899 Other long term (current) drug therapy: Secondary | ICD-10-CM | POA: Diagnosis not present

## 2024-03-09 DIAGNOSIS — F411 Generalized anxiety disorder: Secondary | ICD-10-CM | POA: Diagnosis not present

## 2024-03-25 DIAGNOSIS — F411 Generalized anxiety disorder: Secondary | ICD-10-CM | POA: Diagnosis not present

## 2024-04-08 DIAGNOSIS — F411 Generalized anxiety disorder: Secondary | ICD-10-CM | POA: Diagnosis not present

## 2024-04-23 DIAGNOSIS — F411 Generalized anxiety disorder: Secondary | ICD-10-CM | POA: Diagnosis not present

## 2024-05-06 DIAGNOSIS — F411 Generalized anxiety disorder: Secondary | ICD-10-CM | POA: Diagnosis not present

## 2024-06-17 DIAGNOSIS — F411 Generalized anxiety disorder: Secondary | ICD-10-CM | POA: Diagnosis not present
